# Patient Record
Sex: Male | Born: 1955 | Race: White | Hispanic: No | Smoking: Current every day smoker
Health system: Southern US, Community
[De-identification: ages and names within clinical notes are randomized; demographics above are authoritative.]

## PROBLEM LIST (undated history)

## (undated) DIAGNOSIS — R140 Abdominal distension (gaseous): Secondary | ICD-10-CM

## (undated) DIAGNOSIS — J449 Chronic obstructive pulmonary disease, unspecified: Secondary | ICD-10-CM

## (undated) DIAGNOSIS — R030 Elevated blood-pressure reading, without diagnosis of hypertension: Secondary | ICD-10-CM

## (undated) DIAGNOSIS — Z8619 Personal history of other infectious and parasitic diseases: Secondary | ICD-10-CM

## (undated) DIAGNOSIS — R7301 Impaired fasting glucose: Secondary | ICD-10-CM

## (undated) DIAGNOSIS — G479 Sleep disorder, unspecified: Secondary | ICD-10-CM

## (undated) DIAGNOSIS — Z720 Tobacco use: Secondary | ICD-10-CM

## (undated) DIAGNOSIS — E785 Hyperlipidemia, unspecified: Secondary | ICD-10-CM

## (undated) DIAGNOSIS — R9431 Abnormal electrocardiogram [ECG] [EKG]: Secondary | ICD-10-CM

## (undated) HISTORY — DX: Chronic obstructive pulmonary disease, unspecified: J44.9

## (undated) HISTORY — DX: Impaired fasting glucose: R73.01

## (undated) HISTORY — DX: Personal history of other infectious and parasitic diseases: Z86.19

## (undated) HISTORY — DX: Sleep disorder, unspecified: G47.9

## (undated) HISTORY — DX: Tobacco use: Z72.0

## (undated) HISTORY — DX: Elevated blood-pressure reading, without diagnosis of hypertension: R03.0

## (undated) HISTORY — DX: Hyperlipidemia, unspecified: E78.5

## (undated) HISTORY — DX: Abdominal distension (gaseous): R14.0

## (undated) HISTORY — DX: Abnormal electrocardiogram (ECG) (EKG): R94.31

---

## 2002-08-06 ENCOUNTER — Emergency Department (HOSPITAL_COMMUNITY): Admission: EM | Admit: 2002-08-06 | Discharge: 2002-08-06 | Payer: Self-pay | Admitting: Emergency Medicine

## 2006-08-15 ENCOUNTER — Emergency Department (HOSPITAL_COMMUNITY): Admission: EM | Admit: 2006-08-15 | Discharge: 2006-08-15 | Payer: Self-pay | Admitting: Emergency Medicine

## 2008-09-27 ENCOUNTER — Inpatient Hospital Stay (HOSPITAL_COMMUNITY): Admission: EM | Admit: 2008-09-27 | Discharge: 2008-10-02 | Payer: Self-pay | Admitting: Emergency Medicine

## 2008-09-27 ENCOUNTER — Ambulatory Visit: Payer: Self-pay | Admitting: Cardiology

## 2008-09-30 ENCOUNTER — Encounter: Payer: Self-pay | Admitting: Cardiology

## 2008-09-30 ENCOUNTER — Encounter (INDEPENDENT_AMBULATORY_CARE_PROVIDER_SITE_OTHER): Payer: Self-pay | Admitting: Internal Medicine

## 2008-10-14 DIAGNOSIS — J449 Chronic obstructive pulmonary disease, unspecified: Secondary | ICD-10-CM

## 2008-10-14 DIAGNOSIS — J4489 Other specified chronic obstructive pulmonary disease: Secondary | ICD-10-CM | POA: Insufficient documentation

## 2008-10-28 ENCOUNTER — Inpatient Hospital Stay (HOSPITAL_COMMUNITY): Admission: EM | Admit: 2008-10-28 | Discharge: 2008-10-31 | Payer: Self-pay | Admitting: Emergency Medicine

## 2008-11-12 ENCOUNTER — Encounter (INDEPENDENT_AMBULATORY_CARE_PROVIDER_SITE_OTHER): Payer: Self-pay | Admitting: *Deleted

## 2008-11-12 ENCOUNTER — Ambulatory Visit: Payer: Self-pay | Admitting: Cardiology

## 2008-11-12 DIAGNOSIS — R7309 Other abnormal glucose: Secondary | ICD-10-CM | POA: Insufficient documentation

## 2008-11-12 DIAGNOSIS — E785 Hyperlipidemia, unspecified: Secondary | ICD-10-CM

## 2008-11-12 DIAGNOSIS — F17200 Nicotine dependence, unspecified, uncomplicated: Secondary | ICD-10-CM

## 2008-11-21 ENCOUNTER — Ambulatory Visit (HOSPITAL_COMMUNITY): Admission: RE | Admit: 2008-11-21 | Discharge: 2008-11-21 | Payer: Self-pay | Admitting: Cardiology

## 2008-11-21 ENCOUNTER — Encounter: Payer: Self-pay | Admitting: Cardiology

## 2008-11-21 ENCOUNTER — Ambulatory Visit: Payer: Self-pay | Admitting: Cardiology

## 2008-11-25 ENCOUNTER — Encounter (INDEPENDENT_AMBULATORY_CARE_PROVIDER_SITE_OTHER): Payer: Self-pay | Admitting: *Deleted

## 2008-11-28 ENCOUNTER — Ambulatory Visit: Payer: Self-pay | Admitting: Cardiology

## 2008-11-28 DIAGNOSIS — G479 Sleep disorder, unspecified: Secondary | ICD-10-CM | POA: Insufficient documentation

## 2008-12-12 ENCOUNTER — Telehealth (INDEPENDENT_AMBULATORY_CARE_PROVIDER_SITE_OTHER): Payer: Self-pay | Admitting: *Deleted

## 2008-12-13 ENCOUNTER — Encounter (INDEPENDENT_AMBULATORY_CARE_PROVIDER_SITE_OTHER): Payer: Self-pay

## 2008-12-13 LAB — CONVERTED CEMR LAB
Cholesterol: 202 mg/dL
Free T4: 7.4 ng/dL
HDL: 61 mg/dL
LDL Cholesterol: 119 mg/dL
TSH: 1.477 microintl units/mL
Triglycerides: 108 mg/dL

## 2008-12-25 ENCOUNTER — Encounter (INDEPENDENT_AMBULATORY_CARE_PROVIDER_SITE_OTHER): Payer: Self-pay | Admitting: *Deleted

## 2009-01-03 ENCOUNTER — Encounter (INDEPENDENT_AMBULATORY_CARE_PROVIDER_SITE_OTHER): Payer: Self-pay

## 2009-05-06 ENCOUNTER — Encounter: Payer: Self-pay | Admitting: Urgent Care

## 2009-05-06 ENCOUNTER — Encounter: Payer: Self-pay | Admitting: Internal Medicine

## 2009-05-06 ENCOUNTER — Ambulatory Visit (HOSPITAL_COMMUNITY): Admission: RE | Admit: 2009-05-06 | Discharge: 2009-05-06 | Payer: Self-pay | Admitting: Internal Medicine

## 2009-05-06 ENCOUNTER — Ambulatory Visit: Payer: Self-pay | Admitting: Gastroenterology

## 2009-05-06 DIAGNOSIS — R1084 Generalized abdominal pain: Secondary | ICD-10-CM

## 2009-05-06 DIAGNOSIS — A599 Trichomoniasis, unspecified: Secondary | ICD-10-CM | POA: Insufficient documentation

## 2009-05-06 LAB — CONVERTED CEMR LAB: Creatinine, Ser: 0.68 mg/dL (ref 0.40–1.50)

## 2009-05-12 ENCOUNTER — Encounter (INDEPENDENT_AMBULATORY_CARE_PROVIDER_SITE_OTHER): Payer: Self-pay

## 2009-06-04 ENCOUNTER — Emergency Department (HOSPITAL_COMMUNITY): Admission: EM | Admit: 2009-06-04 | Discharge: 2009-06-04 | Payer: Self-pay | Admitting: Emergency Medicine

## 2009-07-14 ENCOUNTER — Ambulatory Visit: Payer: Self-pay | Admitting: Internal Medicine

## 2009-07-14 ENCOUNTER — Encounter: Payer: Self-pay | Admitting: Gastroenterology

## 2009-07-14 DIAGNOSIS — R1013 Epigastric pain: Secondary | ICD-10-CM

## 2009-07-14 DIAGNOSIS — K921 Melena: Secondary | ICD-10-CM

## 2009-07-14 DIAGNOSIS — R6881 Early satiety: Secondary | ICD-10-CM

## 2009-07-14 DIAGNOSIS — K5909 Other constipation: Secondary | ICD-10-CM

## 2009-07-14 DIAGNOSIS — K3189 Other diseases of stomach and duodenum: Secondary | ICD-10-CM

## 2009-07-19 ENCOUNTER — Emergency Department (HOSPITAL_COMMUNITY): Admission: EM | Admit: 2009-07-19 | Discharge: 2009-07-19 | Payer: Self-pay | Admitting: Emergency Medicine

## 2009-08-08 ENCOUNTER — Inpatient Hospital Stay (HOSPITAL_COMMUNITY): Admission: EM | Admit: 2009-08-08 | Discharge: 2009-08-09 | Payer: Self-pay | Admitting: Emergency Medicine

## 2009-08-11 ENCOUNTER — Ambulatory Visit: Payer: Self-pay | Admitting: Gastroenterology

## 2009-08-11 ENCOUNTER — Ambulatory Visit (HOSPITAL_COMMUNITY): Admission: RE | Admit: 2009-08-11 | Discharge: 2009-08-11 | Payer: Self-pay | Admitting: Gastroenterology

## 2009-10-22 ENCOUNTER — Ambulatory Visit (HOSPITAL_COMMUNITY): Admission: RE | Admit: 2009-10-22 | Discharge: 2009-10-22 | Payer: Self-pay | Admitting: Pulmonary Disease

## 2009-12-16 ENCOUNTER — Ambulatory Visit: Payer: Self-pay | Admitting: Gastroenterology

## 2009-12-16 DIAGNOSIS — K589 Irritable bowel syndrome without diarrhea: Secondary | ICD-10-CM

## 2010-01-29 ENCOUNTER — Inpatient Hospital Stay (HOSPITAL_COMMUNITY): Admission: EM | Admit: 2010-01-29 | Discharge: 2009-03-22 | Payer: Self-pay | Admitting: Emergency Medicine

## 2010-03-04 ENCOUNTER — Emergency Department (HOSPITAL_COMMUNITY)
Admission: EM | Admit: 2010-03-04 | Discharge: 2010-03-04 | Payer: Self-pay | Source: Home / Self Care | Admitting: Emergency Medicine

## 2010-03-07 ENCOUNTER — Emergency Department (HOSPITAL_COMMUNITY)
Admission: EM | Admit: 2010-03-07 | Discharge: 2010-03-07 | Payer: Self-pay | Source: Home / Self Care | Admitting: Emergency Medicine

## 2010-03-09 LAB — CULTURE, ROUTINE-ABSCESS

## 2010-03-15 ENCOUNTER — Encounter: Payer: Self-pay | Admitting: Pulmonary Disease

## 2010-03-24 NOTE — Assessment & Plan Note (Signed)
Summary: BLOATING/SS   Visit Type:  Initial Consult Referring Provider:  Juanetta Gosling Primary Care Provider:  Juanetta Gosling  Chief Complaint:  bloating.  History of Present Illness: 55 y/o caucasian Ford here for FU.  Was seen 05/06/2009 for abd bloating/pain and CT A/P w/ IV/oral contrast was normal.  Colonoscopy was recommended and he has yet to follow-up w/ scheduling this,  c/o continued abd bloating and early satiety.  c/o constipation w/ BM once or twice per week.  2 small volume episodes of hematochezia on tissue.  Denies N/V/D.  Denies heartburn or indigestion.  c/o mid-upper abd bloating.  "Belly always feels tight".  Weight up 4 # in 2 months.  Current Problems (verified): 1)  Early Satiety  (ICD-780.94) 2)  Dyspepsia  (ICD-536.8) 3)  Constipation, Chronic  (ICD-564.09) 4)  Hematochezia  (ICD-578.1) 5)  Abdominal Pain, Generalized  (ICD-789.07) 6)  Sleep Disorder  (ICD-780.50) 7)  Fasting Hyperglycemia  (ICD-790.29) 8)  Hypertension--borderline  (ICD-401.9) 9)  Hyperlipidemia  (ICD-272.4) 10)  Tobacco Abuse  (ICD-305.1) 11)  COPD  (ICD-496) 12)  Hx of Trichomoniasis--ureteritis  (ICD-131.9)  Current Medications (verified): 1)  Aspir-Low 81 Mg Tbec (Aspirin) .... Take 1 Tab Daily 2)  Albuterol Sulfate (5 Mg/ml) 0.5% Nebu (Albuterol Sulfate) 3)  Atrovent 0.06 % Soln (Ipratropium Bromide) 4)  Advair Diskus 250-50 Mcg/dose Aepb (Fluticasone-Salmeterol) 5)  Daily Multi  Tabs (Multiple Vitamins-Minerals) .... Take 1 Tab Daily 6)  Percocet 5-325 Mg Tabs (Oxycodone-Acetaminophen) .... One Tablet Every 4 Hours As Needed 7)  Furosemide 40 Mg Tabs (Furosemide) .... Take 1 Tablet By Mouth Once A Day 8)  Requip 1 Mg Tabs (Ropinirole Hcl) .... Take 1 Tablet By Mouth Once A Day 9)  Combivent Inhaler .... As Needed 10)  Pro Air .... As Directed 11)  Proventil .... As Directed 12)  Oxygen .... At Bedtime or As Needed 13)  Ventolin Hfa 108 (90 Base) Mcg/act Aers (Albuterol Sulfate) .... As  Directed  Allergies (verified): No Known Drug Allergies  Past History:  Past Medical History: COPD (ICD-496): 70-pack-year history of cigarette smoking, 2.5liters qhs Abnormal electrocardiogram: Deep T-wave inversion, particularly anterolaterally, on 09/28/2008; subsequently resolved. History of Trichomonas urethritis ABDOMINAL PAIN & BLOATING, early satiety SLEEP DISORDER (ICD-780.50) FASTING HYPERGLYCEMIA (ICD-790.29) HYPERTENSION--BORDERLINE (ICD-401.9) HYPERLIPIDEMIA (ICD-272.4) TOBACCO ABUSE (ICD-305.1) Never had colonoscopy  Review of Systems General:  Complains of fatigue; denies fever, chills, sweats, anorexia, weakness, malaise, weight loss, and sleep disorder. CV:  Denies chest pains, angina, palpitations, syncope, dyspnea on exertion, orthopnea, PND, peripheral edema, and claudication. Resp:  Denies dyspnea at rest, dyspnea with exercise, cough, sputum, wheezing, coughing up blood, and pleurisy. GI:  See HPI. GU:  Complains of urinary frequency; denies urinary burning, blood in urine, urinary hesitancy, and nocturnal urination. Derm:  Denies rash, itching, dry skin, hives, moles, warts, and unhealing ulcers. Psych:  Denies depression, anxiety, memory loss, suicidal ideation, hallucinations, paranoia, phobia, and confusion. Heme:  Denies bruising, bleeding, and enlarged lymph nodes.  Vital Signs:  Patient profile:   55 year old Ford Height:      65 inches Weight:      203 pounds BMI:     33.90 Temp:     99.2 degrees F Pulse rate:   96 / minute BP sitting:   110 / 78  (left arm) Cuff size:   large  Vitals Entered By: Cloria Spring LPN (Jul 14, 2009 1:37 PM)  Physical Exam  General:  Well developed, well nourished,w/ c/o pain Head:  Normocephalic and  atraumatic. Eyes:  Sclera clear, no icterus. Ears:  Normal auditory acuity. Nose:  No deformity, discharge,  or lesions. Mouth:  No deformity or lesions, poor dentition Neck:  Supple; no masses or  thyromegaly. Lungs:  Decreased BS w/ insp/exp wheezes. NAD. Heart:  Regular rate and rhythm; no murmurs, rubs,  or bruits. Abdomen:  Soft, mildly distended. No masses, hepatosplenomegaly or hernias noted. Normal bowel sounds.without guarding and without rebound.  Tenderness to mid abd, and mod tenderness diffusely. Rectal:  deferred until time of colonoscopy.   Msk:  Symmetrical with no gross deformities. Normal posture. Pulses:  Normal pulses noted. Extremities:  No clubbing, cyanosis, edema or deformities noted. Neurologic:  Alert and  oriented x4;  grossly normal neurologically. Skin:  Intact without significant lesions or rashes. Cervical Nodes:  No significant cervical adenopathy. Psych:  Alert and cooperative. Normal mood and affect.  Impression & Recommendations:  Problem # 1:  EARLY SATIETY (ICD-780.94) 55 y/o caucasian Ford w/ chronic abd bloating, pain, ear;y satiety, constipation & hematochezia.  CT benign.  Severe COPD.  Needs colonoscopy and EGD. ? related to chronic constipation, SBBO, functional abd pain, less likely colorectal ca, or PUD.  Diagnostic colonoscopy and EGD to be performed by Dr. Jonette Eva in the near future.  I have discussed risks and benefits which include, but are not limited to, bleeding, infection, perforation, or medication reaction.  The patient agrees with this plan and consent will be obtained.   Orders: Est. Patient Level III ([address]40)  Problem # 2:  ABDOMINAL PAIN, GENERALIZED (ICD-789.07) See #1  Problem # 3:  DYSPEPSIA (ICD-536.8) See #1  Problem # 4:  HEMATOCHEZIA (ICD-578.1) See #1  Problem # 5:  CONSTIPATION, CHRONIC (ICD-564.09) See #1  Patient Instructions: 1)  Begin Align once daily (2 boxes) 2)  Begin miralax as directed for constipation Prescriptions: MIRALAX  POWD (POLYETHYLENE GLYCOL 3350) 17 grams by mouth daily as needed for constipation  #527 grams x 5   Entered and Authorized by:   Joselyn Arrow FNP-BC   Signed by:    Joselyn Arrow FNP-BC on 07/14/2009   Method used:   Electronically to        [facility]  [address] (retail)       [address]       [place], [place]  [address]       Ph: [phone]       Fax: [phone]   RxID:   [id number]   Appended Document: BLOATING/SS APR 2011: 198 LBS

## 2010-03-24 NOTE — Letter (Signed)
Summary: TCS/EGD ORDER  TCS/EGD ORDER   Imported By: Ave Filter 07/14/2009 15:04:44  _____________________________________________________________________  External Attachment:    Type:   Image     Comment:   External Document

## 2010-03-24 NOTE — Assessment & Plan Note (Signed)
Summary: ABD PAIN/CM   Visit Type:  Initial Consult Referring Provider:  Dr Juanetta Gosling Primary Care Provider:  Juanetta Gosling  Chief Complaint:  abd pain/tightness.  History of Present Illness: 55 y/o caucasian male w/ mid abd pain & "feeling tight" x couple months.  c/o constant pressure 10/10.  No particular time of day, worse w/ movt, not assoc w/ N/V/D.  Denies any hx constipation.  Pain not assoc w/ eating.  Occ heartburn & indigestion couple times per yr.  Weight gain 25# over 2-3 months.  BM BID.  Denies rectal bleeding or melena.  Denies hx GI problems.  Hx arthritis in back & peripheral neuropathy. Pt has oxycodone at home that he takes as needed pain.    Current Problems (verified): 1)  Abdominal Pain, Generalized  (ICD-789.07) 2)  Sleep Disorder  (ICD-780.50) 3)  Abnormal Electrocardiogram  (ICD-794.31) 4)  Fasting Hyperglycemia  (ICD-790.29) 5)  Hypertension--borderline  (ICD-401.9) 6)  Hyperlipidemia  (ICD-272.4) 7)  Tobacco Abuse  (ICD-305.1) 8)  COPD  (ICD-496) 9)  Hx of Trichomoniasis--ureteritis  (ICD-131.9)  Current Medications (verified): 1)  Aspir-Low 81 Mg Tbec (Aspirin) .... Take 1 Tab Daily 2)  Albuterol Sulfate (5 Mg/ml) 0.5% Nebu (Albuterol Sulfate) 3)  Atrovent 0.06 % Soln (Ipratropium Bromide) 4)  Advair Diskus 250-50 Mcg/dose Aepb (Fluticasone-Salmeterol) 5)  Daily Multi  Tabs (Multiple Vitamins-Minerals) .... Take 1 Tab Daily 6)  Percocet 5-325 Mg Tabs (Oxycodone-Acetaminophen) .... One Tablet Every 4 Hours As Needed 7)  Furosemide 40 Mg Tabs (Furosemide) .... Take 1 Tablet By Mouth Once A Day 8)  Requip 1 Mg Tabs (Ropinirole Hcl) .... Take 1 Tablet By Mouth Once A Day 9)  Combivent Inhaler .... As Needed 10)  Pro Air .... As Directed 11)  Proventil .... As Directed 12)  Oxygen .... At Bedtime or As Needed  Allergies (verified): No Known Drug Allergies  Past History:  Past Medical History: COPD (ICD-496): 70-pack-year history of cigarette  smoking Abnormal electrocardiogram: Deep T-wave inversion, particularly anterolaterally, on 09/28/2008; subsequently resolved. History of Trichomonas urethritis ABDOMINAL PAIN, GENERALIZED (ICD-789.07) SLEEP DISORDER (ICD-780.50) ABNORMAL ELECTROCARDIOGRAM (ICD-794.31) FASTING HYPERGLYCEMIA (ICD-790.29) HYPERTENSION--BORDERLINE (ICD-401.9) HYPERLIPIDEMIA (ICD-272.4) TOBACCO ABUSE (ICD-305.1) Never had colonoscopy  Family History: Father:(82) ? arrhythmia Mother:(deceased 5) due to pancreatic cancer; history of coronary artery disease, hypertension and diabetes Siblings:8 ?  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   Review of Systems General:  Complains of sleep disorder; denies fever, chills, sweats, anorexia, fatigue, weakness, malaise, and weight loss. CV:  Denies chest pains, angina, palpitations, syncope, dyspnea on exertion, orthopnea, PND, peripheral edema, and claudication. Resp:  Denies dyspnea at rest, dyspnea with exercise, cough, sputum, wheezing, coughing up blood, and pleurisy. GI:  Denies difficulty swallowing, pain on swallowing, jaundice, bloody BM's, black BMs, and fecal incontinence. GU:  Denies urinary burning, blood in urine, urinary frequency, urinary hesitancy, nocturnal urination, and urinary incontinence. Derm:  Complains of rash; denies itching, dry skin, hives, moles, warts, and unhealing ulcers; rash on back few wks ago "looked like chicken pox". Psych:  Denies depression, anxiety, memory loss, suicidal ideation, hallucinations, paranoia, phobia, and confusion. Heme:  Complains of bruising; denies bleeding and enlarged lymph nodes.  Vital Signs:  Patient profile:   55 year old male Height:  65 inches Weight:      199 pounds BMI:      33.24 Temp:     97.9 degrees F oral Pulse rate:   96 / minute BP sitting:   142 / 80  (left arm) Cuff size:   regular  Vitals Entered By: Cloria Spring LPN (May 06, 2009 11:47 AM)  Physical Exam  General:  Well developed, well nourished,w/ c/o pain Head:  Normocephalic and atraumatic. Eyes:  Sclera clear, no icterus. Ears:  Normal auditory acuity. Nose:  No deformity, discharge,  or lesions. Mouth:  No deformity or lesions, dentition normal. Neck:  Supple; no masses or thyromegaly. Lungs:  Clear throughout to auscultation. Heart:  Regular rate and rhythm; no murmurs, rubs,  or bruits. Abdomen:  Soft, mildly distended. No masses, hepatosplenomegaly or hernias noted. Normal bowel sounds.without guarding and without rebound.  Tenderness to mid abd, and mod tenderness diffusely. Rectal:  deferred until time of colonoscopy.   Msk:  Symmetrical with no gross deformities. Normal posture. Pulses:  Normal pulses noted. Extremities:  No clubbing, cyanosis, edema or deformities noted. Neurologic:  Alert and  oriented x4;  grossly normal neurologically. Skin:  Intact without significant lesions or rashes. Cervical Nodes:  No significant cervical adenopathy. Psych:  Alert and cooperative. Normal mood and affect.  Impression & Recommendations:  Problem # 1:  ABDOMINAL PAIN, GENERALIZED (ICD-789.07) 55 y/o caucasian male w/ hx COPD & chronic back pain with 1 month hx worsening severe abd pain not associated w/ nausea, vomiting, change in bowel habits or fever.  ?etiology of severe diffuse pain ?appendicitis, pancreatitis, diverticulitis, AAA, ischemia, occult malignancy.  Will need colonoscopy as well as CT ABD/Pelvis w/ IV/oral contrast asap.  Will check creatinine 1st.  Request recent labs from Dr. Juanetta Gosling.  Colonoscopy to be performed by Dr. Darrick Penna in the near future.  I have discussed risks and benefits which include, but are not limited to, bleeding, infection, perforation, or  medication reaction.  The patient agrees with this plan and consent will be obtained.   Orders: Consultation Level III [phone])  Other Orders: T-Creatinine Blood ([id number])  Patient Instructions: 1)  CT As soon as possible 2)  To ER if severe pain, nausea or vomiting 3)  We requested labs from Dr Juanetta Gosling.  Will call if more labs needed.     Appended Document: ABD PAIN/CM Please call pt. His CT scan was normal. Proceed with TCS. He should see Dr. Juanetta Gosling and ask him to check his urine.   Appended Document: ABD PAIN/CM LMOM to call.  Appended Document: ABD PAIN/CM tried to call pt- LMOM  Appended Document: ABD PAIN/CM LMOM to call. Mailing letter to call also.  Appended Document: ABD PAIN/CM 04/17/2009 labs reviewed-> Met 7, Mag normal  Appended Document: ABD PAIN/CM I called pt to schedule tcs, no answer,lmom.  Appended Document: ABD PAIN/CM spoke to pt- he said he will call back when hes ready to schedule tcs.

## 2010-03-24 NOTE — Letter (Signed)
Summary: CT SCAN ORDER  CT SCAN ORDER   Imported By: Ave Filter 05/06/2009 13:20:32  _____________________________________________________________________  External Attachment:    Type:   Image     Comment:   External Document

## 2010-03-24 NOTE — Assessment & Plan Note (Signed)
Summary: IBS   Visit Type:  Follow-up Visit Primary Care Danayah Smyre:  Juanetta Gosling, m.d.  Chief Complaint:  follow up- having some abd pain and ? hernia.  History of Present Illness: Gained 9 lbs since MAY 2011. Smelly urine and BMs. Takes Percocet 1.5 tabs a day. Water: whole lot of tea. BMs: q1-2 days, soft. Appetite: LIKES MILK, ICE CREAM, AND CHEESE.  Current Medications (verified): 1)  Aspir-Low 81 Mg Tbec (Aspirin) .... Take 1 Tab Daily 2)  Albuterol Sulfate (5 Mg/ml) 0.5% Nebu (Albuterol Sulfate) 3)  Atrovent 0.06 % Soln (Ipratropium Bromide) 4)  Advair Diskus 250-50 Mcg/dose Aepb (Fluticasone-Salmeterol) 5)  Daily Multi  Tabs (Multiple Vitamins-Minerals) .... Take 1 Tab Daily 6)  Percocet 5-325 Mg Tabs (Oxycodone-Acetaminophen) .... One Tablet Every 4 Hours As Needed 7)  Requip 1 Mg Tabs (Ropinirole Hcl) .... Take 1 Tablet By Mouth Once A Day 8)  Combivent Inhaler .... As Needed 9)  Pro Air .... As Directed 10)  Proventil .... As Directed 11)  Oxygen .... At Bedtime or As Needed 12)  Ventolin Hfa 108 (90 Base) Mcg/act Aers (Albuterol Sulfate) .... As Directed  Allergies (verified): No Known Drug Allergies  Past History:  Past Surgical History: Last updated: 10/14/2008 no past surgical history  Past Medical History: COPD (ICD-496): 70-pack-year history of cigarette smoking, 2.5liters qhs Abnormal electrocardiogram: Deep T-wave inversion, particularly anterolaterally, on 09/28/2008; subsequently resolved. History of Trichomonas urethritis ABDOMINAL PAIN & BLOATING, early satiety SLEEP DISORDER (ICD-780.50) FASTING HYPERGLYCEMIA (ICD-790.29) HYPERTENSION--BORDERLINE (ICD-401.9) HYPERLIPIDEMIA (ICD-272.4) TOBACCO ABUSE (ICD-305.1)  Family History: Father:(82) ? arrhythmia Mother:(deceased 38) due to pancreatic cancer; history of coronary artery disease, hypertension and diabetes Siblings:8 ? 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   Vital Signs:  Patient profile:   55 year old male Height:      65 inches Weight:      212 pounds BMI:     35.41 Temp:     98.2 degrees F oral Pulse rate:   80 / minute BP sitting:   142 / 76  (left arm) Cuff size:   regular  Vitals Entered By: Hendricks Limes LPN (December 16, 2009 8:50 AM)  Physical Exam  General:  Well developed, well nourished, no acute distress. Head:  Normocephalic and atraumatic. Eyes:  Sclera clear, no icterus. Mouth:  No deformity or lesions, poor dentition Lungs:  Clear throughout to auscultation. Heart:  Regular rate and rhythm; no murmurs. Abdomen:  Soft, nontender and nondistended. Normal bowel sounds. ABD WALL DEFECT WITH VALSALVA. Extremities:  No edema noted.  Impression & Recommendations:  Problem # 1:  IRRITABLE BOWEL SYNDROME (ICD-564.1) Assessment Unchanged EAT 4-6 SMALL MEALS DAILY. DRNK 6 TO 8 CUPS OF WATER A DAY. STOP DRINKING TEA. LOSE 10-20 LBS. FOLLOW A HIGH FIBER DIET. SEE HO. Avoid fiber items that cause bloating. Follow up in 4 mos. Consider imipramine or SSRI if Sx not significantly improved.  CC: PCP  Patient Instructions: 1)  EAT 4-6 SMALL MEALS DAILY. 2)  DRNK 6 TO 8 CUPS OF WATER A DAY. 3)  STOP DRINKING TEA. 4)  LOSE 10-20 LBS. 5)  FOLLOW A HIGH FIBER DIET. SEE HO. 6)  Avoid fiber items that cause bloating.  7)  Follow up in 4 mos. 8)  The medication list was reviewed and reconciled.  All changed / newly prescribed medications were explained.  A complete medication list was provided to the patient / caregiver.  Appended Document: Orders Update    Clinical Lists Changes  Orders: Added new Service order of Est. Patient Level III ([address]) - Signed      Appended Document: IBS  F/U REMINDER IS IN THE COMPUTER

## 2010-03-24 NOTE — Letter (Signed)
Summary: Plan of Care, Need to Discuss  [facility]  [address]   [place], [place] [address]   Phone: [phone]  Fax: [phone]    May 12, 2009  Zachary Ford [address] [place], [place]  [address] [birth year]   Dear Zachary Ford,   We are writing this letter to inform you of treatment plans and/or discuss your plan of care.  We have tried several times to contact you; however, we have yet to reach you.  We ask that you please contact our office for follow-up on your gastrointestinal issues.  We can  be reached at [phone] to schedule an appointment, or to speak with someone regarding your health care needs.  Please do not neglect your health.   Sincerely,    Cloria Spring LPN  [facility] Ph: [phone]    Fax: [phone]

## 2010-04-02 ENCOUNTER — Encounter (INDEPENDENT_AMBULATORY_CARE_PROVIDER_SITE_OTHER): Payer: Self-pay | Admitting: *Deleted

## 2010-04-09 NOTE — Letter (Signed)
Summary: Recall Office Visit  [facility]  [address]   [place], [place] [address]   Phone: [phone]  Fax: [phone]      April 02, 2010   RHYLAND HINDERLITER [address] [place], [place]  [address] 1956/01/20   Dear Mr. Schremp,   According to our records, it is time for you to schedule a follow-up office visit with [place].   At your convenience, please call [phone] to schedule an office visit. If you have any questions, concerns, or feel that this letter is in error, we would appreciate your call.   Sincerely,    Diana Eves  [facility] Ph: [phone]   Fax: [phone]

## 2010-05-10 LAB — CBC
HCT: 42.4 % (ref 39.0–52.0)
Hemoglobin: 14.1 g/dL (ref 13.0–17.0)
MCHC: 33.2 g/dL (ref 30.0–36.0)
MCHC: 33.3 g/dL (ref 30.0–36.0)
MCHC: 33.3 g/dL (ref 30.0–36.0)
MCV: 89.7 fL (ref 78.0–100.0)
Platelets: 423 10*3/uL — ABNORMAL HIGH (ref 150–400)
Platelets: 281 10*3/uL (ref 150–400)
RBC: 4.58 MIL/uL (ref 4.22–5.81)
RBC: 4.73 MIL/uL (ref 4.22–5.81)
RDW: 12.5 % (ref 11.5–15.5)
RDW: 12.8 % (ref 11.5–15.5)
RDW: 13.3 % (ref 11.5–15.5)
WBC: 18.8 10*3/uL — ABNORMAL HIGH (ref 4.0–10.5)

## 2010-05-10 LAB — CULTURE, BLOOD (ROUTINE X 2)
Culture: NO GROWTH
Culture: NO GROWTH

## 2010-05-10 LAB — DIFFERENTIAL
Basophils Absolute: 0 10*3/uL (ref 0.0–0.1)
Basophils Absolute: 0 10*3/uL (ref 0.0–0.1)
Basophils Absolute: 0 10*3/uL (ref 0.0–0.1)
Basophils Relative: 0 % (ref 0–1)
Basophils Relative: 0 % (ref 0–1)
Basophils Relative: 0 % (ref 0–1)
Lymphocytes Relative: 14 % (ref 12–46)
Monocytes Absolute: 1.7 10*3/uL — ABNORMAL HIGH (ref 0.1–1.0)
Monocytes Relative: 4 % (ref 3–12)
Monocytes Relative: 7 % (ref 3–12)
Neutro Abs: 19.4 10*3/uL — ABNORMAL HIGH (ref 1.7–7.7)
Neutro Abs: 26.8 10*3/uL — ABNORMAL HIGH (ref 1.7–7.7)
Neutro Abs: 14.1 10*3/uL — ABNORMAL HIGH (ref 1.7–7.7)
Neutrophils Relative %: 83 % — ABNORMAL HIGH (ref 43–77)
Neutrophils Relative %: 91 % — ABNORMAL HIGH (ref 43–77)
Neutrophils Relative %: 75 % (ref 43–77)

## 2010-05-10 LAB — URINALYSIS, ROUTINE W REFLEX MICROSCOPIC
Protein, ur: >300 mg/dL — AB
Urobilinogen, UA: 1 mg/dL (ref 0.0–1.0)

## 2010-05-10 LAB — URINE MICROSCOPIC-ADD ON

## 2010-05-10 LAB — URINE CULTURE

## 2010-05-10 LAB — BASIC METABOLIC PANEL
BUN: 10 mg/dL (ref 6–23)
CO2: 33 mEq/L — ABNORMAL HIGH (ref 19–32)
Calcium: 8.8 mg/dL (ref 8.4–10.5)
Creatinine, Ser: 0.61 mg/dL (ref 0.4–1.5)
Glucose, Bld: 119 mg/dL — ABNORMAL HIGH (ref 70–99)

## 2010-05-10 LAB — COMPREHENSIVE METABOLIC PANEL
Albumin: 3.8 g/dL (ref 3.5–5.2)
Alkaline Phosphatase: 64 U/L (ref 39–117)
BUN: 7 mg/dL (ref 6–23)
Creatinine, Ser: 0.62 mg/dL (ref 0.4–1.5)
Glucose, Bld: 113 mg/dL — ABNORMAL HIGH (ref 70–99)
Potassium: 3.8 mEq/L (ref 3.5–5.1)
Total Protein: 6.9 g/dL (ref 6.0–8.3)

## 2010-05-10 LAB — POCT CARDIAC MARKERS: CKMB, poc: 4.2 ng/mL (ref 1.0–8.0)

## 2010-05-10 LAB — PROTIME-INR: INR: 0.92 (ref 0.00–1.49)

## 2010-05-29 LAB — CULTURE, BLOOD (ROUTINE X 2)
Culture: NO GROWTH
Culture: NO GROWTH
Report Status: 9112010

## 2010-05-29 LAB — BLOOD GAS, ARTERIAL
Acid-Base Excess: 2.1 mmol/L — ABNORMAL HIGH (ref 0.0–2.0)
Acid-Base Excess: 4.7 mmol/L — ABNORMAL HIGH (ref 0.0–2.0)
Bicarbonate: 30 mEq/L — ABNORMAL HIGH (ref 20.0–24.0)
Bicarbonate: 30.4 mEq/L — ABNORMAL HIGH (ref 20.0–24.0)
Delivery systems: POSITIVE
Expiratory PAP: 6
Expiratory PAP: 6
FIO2: 100 %
FIO2: 21 %
Inspiratory PAP: 16
Mode: POSITIVE
O2 Content: 2 L/min
O2 Content: 2 L/min
O2 Content: 3 L/min
O2 Saturation: 91 %
O2 Saturation: 93.3 %
O2 Saturation: 98.5 %
O2 Saturation: 99.4 %
Patient temperature: 37
Patient temperature: 37
Patient temperature: 37
Patient temperature: 37
TCO2: 26.1 mmol/L (ref 0–100)
TCO2: 26.7 mmol/L (ref 0–100)
TCO2: 26.8 mmol/L (ref 0–100)
pCO2 arterial: 52.4 mmHg — ABNORMAL HIGH (ref 35.0–45.0)
pCO2 arterial: 53 mmHg — ABNORMAL HIGH (ref 35.0–45.0)
pCO2 arterial: 59.5 mmHg (ref 35.0–45.0)
pCO2 arterial: 75.5 mmHg (ref 35.0–45.0)
pH, Arterial: 7.236 — ABNORMAL LOW (ref 7.350–7.450)
pH, Arterial: 7.326 — ABNORMAL LOW (ref 7.350–7.450)
pH, Arterial: 7.362 (ref 7.350–7.450)
pH, Arterial: 7.372 (ref 7.350–7.450)
pO2, Arterial: 363 mmHg — ABNORMAL HIGH (ref 80.0–100.0)
pO2, Arterial: 57.2 mmHg — ABNORMAL LOW (ref 80.0–100.0)
pO2, Arterial: 71.3 mmHg — ABNORMAL LOW (ref 80.0–100.0)
pO2, Arterial: 74.8 mmHg — ABNORMAL LOW (ref 80.0–100.0)

## 2010-05-29 LAB — GLUCOSE, CAPILLARY
Glucose-Capillary: 121 mg/dL — ABNORMAL HIGH (ref 70–99)
Glucose-Capillary: 128 mg/dL — ABNORMAL HIGH (ref 70–99)
Glucose-Capillary: 146 mg/dL — ABNORMAL HIGH (ref 70–99)

## 2010-05-29 LAB — DIFFERENTIAL
Basophils Absolute: 0 10*3/uL (ref 0.0–0.1)
Basophils Relative: 0 % (ref 0–1)
Basophils Relative: 0 % (ref 0–1)
Eosinophils Absolute: 0 10*3/uL (ref 0.0–0.7)
Eosinophils Absolute: 0 10*3/uL (ref 0.0–0.7)
Eosinophils Relative: 0 % (ref 0–5)
Eosinophils Relative: 0 % (ref 0–5)
Lymphocytes Relative: 11 % — ABNORMAL LOW (ref 12–46)
Lymphocytes Relative: 5 % — ABNORMAL LOW (ref 12–46)
Lymphs Abs: 0.9 10*3/uL (ref 0.7–4.0)
Lymphs Abs: 1.8 10*3/uL (ref 0.7–4.0)
Monocytes Absolute: 0.9 10*3/uL (ref 0.1–1.0)
Monocytes Relative: 3 % (ref 3–12)
Monocytes Relative: 5 % (ref 3–12)
Monocytes Relative: 8 % (ref 3–12)
Neutro Abs: 13 10*3/uL — ABNORMAL HIGH (ref 1.7–7.7)
Neutrophils Relative %: 78 % — ABNORMAL HIGH (ref 43–77)
Neutrophils Relative %: 90 % — ABNORMAL HIGH (ref 43–77)
Neutrophils Relative %: 91 % — ABNORMAL HIGH (ref 43–77)

## 2010-05-29 LAB — CBC
HCT: 42.6 % (ref 39.0–52.0)
Hemoglobin: 16.2 g/dL (ref 13.0–17.0)
MCHC: 33.8 g/dL (ref 30.0–36.0)
MCV: 89.3 fL (ref 78.0–100.0)
MCV: 89.7 fL (ref 78.0–100.0)
Platelets: 238 10*3/uL (ref 150–400)
Platelets: 251 10*3/uL (ref 150–400)
RBC: 4.76 MIL/uL (ref 4.22–5.81)
RBC: 4.88 MIL/uL (ref 4.22–5.81)
RBC: 5.39 MIL/uL (ref 4.22–5.81)
WBC: 12.6 10*3/uL — ABNORMAL HIGH (ref 4.0–10.5)
WBC: 16.6 10*3/uL — ABNORMAL HIGH (ref 4.0–10.5)
WBC: 19 10*3/uL — ABNORMAL HIGH (ref 4.0–10.5)

## 2010-05-29 LAB — POCT CARDIAC MARKERS
Myoglobin, poc: 60.9 ng/mL (ref 12–200)
Myoglobin, poc: 73.2 ng/mL (ref 12–200)

## 2010-05-29 LAB — COMPREHENSIVE METABOLIC PANEL
AST: 20 U/L (ref 0–37)
Albumin: 3.1 g/dL — ABNORMAL LOW (ref 3.5–5.2)
Alkaline Phosphatase: 38 U/L — ABNORMAL LOW (ref 39–117)
Chloride: 103 mEq/L (ref 96–112)
GFR calc Af Amer: >60 mL/min (ref >60)
Potassium: 4.6 mEq/L (ref 3.5–5.1)
Sodium: 142 mEq/L (ref 135–145)
Total Bilirubin: 0.5 mg/dL (ref 0.3–1.2)

## 2010-05-29 LAB — BASIC METABOLIC PANEL
BUN: 14 mg/dL (ref 6–23)
CO2: 30 mEq/L (ref 19–32)
Calcium: 9 mg/dL (ref 8.4–10.5)
Chloride: 101 mEq/L (ref 96–112)
Chloride: 103 mEq/L (ref 96–112)
Creatinine, Ser: 0.69 mg/dL (ref 0.4–1.5)
Creatinine, Ser: 0.76 mg/dL (ref 0.4–1.5)
Creatinine, Ser: 0.77 mg/dL (ref 0.4–1.5)
GFR calc Af Amer: >60 mL/min (ref >60)
GFR calc Af Amer: >60 mL/min (ref >60)
Potassium: 4.4 mEq/L (ref 3.5–5.1)
Potassium: 4.4 mEq/L (ref 3.5–5.1)
Sodium: 139 mEq/L (ref 135–145)

## 2010-05-29 LAB — D-DIMER, QUANTITATIVE: D-Dimer, Quant: 0.51 ug/mL-FEU — ABNORMAL HIGH (ref 0.00–0.48)

## 2010-05-29 LAB — EXPECTORATED SPUTUM ASSESSMENT W GRAM STAIN, RFLX TO RESP C

## 2010-05-29 LAB — CULTURE, RESPIRATORY W GRAM STAIN

## 2010-05-30 LAB — BLOOD GAS, ARTERIAL
Acid-Base Excess: 12 mmol/L — ABNORMAL HIGH (ref 0.0–2.0)
Acid-Base Excess: 12.2 mmol/L — ABNORMAL HIGH (ref 0.0–2.0)
Acid-Base Excess: 5.5 mmol/L — ABNORMAL HIGH (ref 0.0–2.0)
Acid-Base Excess: 5.7 mmol/L — ABNORMAL HIGH (ref 0.0–2.0)
Acid-Base Excess: 6.4 mmol/L — ABNORMAL HIGH (ref 0.0–2.0)
Acid-Base Excess: 8.4 mmol/L — ABNORMAL HIGH (ref 0.0–2.0)
Bicarbonate: 30.6 mEq/L — ABNORMAL HIGH (ref 20.0–24.0)
Bicarbonate: 30.8 mEq/L — ABNORMAL HIGH (ref 20.0–24.0)
Bicarbonate: 31.7 mEq/L — ABNORMAL HIGH (ref 20.0–24.0)
Bicarbonate: 34 mEq/L — ABNORMAL HIGH (ref 20.0–24.0)
Bicarbonate: 37.9 mEq/L — ABNORMAL HIGH (ref 20.0–24.0)
Bicarbonate: 39.4 mEq/L — ABNORMAL HIGH (ref 20.0–24.0)
Delivery systems: POSITIVE
Delivery systems: POSITIVE
Expiratory PAP: 8
FIO2: 0.35 %
FIO2: 0.4 %
Inspiratory PAP: 20
Inspiratory PAP: 20
Mode: POSITIVE
O2 Content: 2 L/min
O2 Content: 35 L/min
O2 Content: 40 L/min
O2 Content: 50 L/min
O2 Saturation: 93.4 %
O2 Saturation: 96.3 %
O2 Saturation: 96.3 %
O2 Saturation: 97 %
O2 Saturation: 97.4 %
O2 Saturation: 97.9 %
PEEP: 5 cmH2O
Patient temperature: 37
Patient temperature: 37
Patient temperature: 37
Patient temperature: 98.6
TCO2: 27.3 mmol/L (ref 0–100)
TCO2: 29.3 mmol/L (ref 0–100)
TCO2: 34.7 mmol/L (ref 0–100)
TCO2: 34.8 mmol/L (ref 0–100)
TCO2: 35.3 mmol/L (ref 0–100)
pCO2 arterial: 44.7 mmHg (ref 35.0–45.0)
pCO2 arterial: 57 mmHg — ABNORMAL HIGH (ref 35.0–45.0)
pCO2 arterial: 62.2 mmHg (ref 35.0–45.0)
pCO2 arterial: 89.3 mmHg (ref 35.0–45.0)
pCO2 arterial: 91.4 mmHg (ref 35.0–45.0)
pCO2 arterial: 99.1 mmHg (ref 35.0–45.0)
pH, Arterial: 7.227 — ABNORMAL LOW (ref 7.350–7.450)
pH, Arterial: 7.24 — ABNORMAL LOW (ref 7.350–7.450)
pH, Arterial: 7.258 — ABNORMAL LOW (ref 7.350–7.450)
pH, Arterial: 7.356 (ref 7.350–7.450)
pH, Arterial: 7.406 (ref 7.350–7.450)
pO2, Arterial: 187 mmHg — ABNORMAL HIGH (ref 80.0–100.0)
pO2, Arterial: 76.7 mmHg — ABNORMAL LOW (ref 80.0–100.0)
pO2, Arterial: 83.7 mmHg (ref 80.0–100.0)
pO2, Arterial: 87.1 mmHg (ref 80.0–100.0)
pO2, Arterial: 89.4 mmHg (ref 80.0–100.0)
pO2, Arterial: 97.6 mmHg (ref 80.0–100.0)

## 2010-05-30 LAB — CBC
HCT: 42.5 % (ref 39.0–52.0)
HCT: 43 % (ref 39.0–52.0)
HCT: 43.3 % (ref 39.0–52.0)
HCT: 47.9 % (ref 39.0–52.0)
Hemoglobin: 14.4 g/dL (ref 13.0–17.0)
Hemoglobin: 16 g/dL (ref 13.0–17.0)
MCHC: 33.3 g/dL (ref 30.0–36.0)
MCHC: 33.5 g/dL (ref 30.0–36.0)
MCV: 91.1 fL (ref 78.0–100.0)
MCV: 91.7 fL (ref 78.0–100.0)
MCV: 91.8 fL (ref 78.0–100.0)
MCV: 91.9 fL (ref 78.0–100.0)
MCV: 92.3 fL (ref 78.0–100.0)
Platelets: 209 10*3/uL (ref 150–400)
Platelets: 229 10*3/uL (ref 150–400)
Platelets: 229 10*3/uL (ref 150–400)
RBC: 4.63 MIL/uL (ref 4.22–5.81)
RBC: 4.72 MIL/uL (ref 4.22–5.81)
RBC: 4.73 MIL/uL (ref 4.22–5.81)
RBC: 4.8 MIL/uL (ref 4.22–5.81)
RBC: 5.21 MIL/uL (ref 4.22–5.81)
RBC: 5.58 MIL/uL (ref 4.22–5.81)
WBC: 13.1 10*3/uL — ABNORMAL HIGH (ref 4.0–10.5)
WBC: 13.8 10*3/uL — ABNORMAL HIGH (ref 4.0–10.5)
WBC: 14.8 10*3/uL — ABNORMAL HIGH (ref 4.0–10.5)
WBC: 15.1 10*3/uL — ABNORMAL HIGH (ref 4.0–10.5)
WBC: 16.3 10*3/uL — ABNORMAL HIGH (ref 4.0–10.5)
WBC: 5.9 10*3/uL (ref 4.0–10.5)

## 2010-05-30 LAB — BASIC METABOLIC PANEL
BUN: 13 mg/dL (ref 6–23)
BUN: 13 mg/dL (ref 6–23)
CO2: 34 mEq/L — ABNORMAL HIGH (ref 19–32)
CO2: 39 mEq/L — ABNORMAL HIGH (ref 19–32)
Calcium: 9 mg/dL (ref 8.4–10.5)
Calcium: 9.2 mg/dL (ref 8.4–10.5)
Chloride: 100 mEq/L (ref 96–112)
Chloride: 104 mEq/L (ref 96–112)
Chloride: 91 mEq/L — ABNORMAL LOW (ref 96–112)
Chloride: 92 mEq/L — ABNORMAL LOW (ref 96–112)
Creatinine, Ser: 0.67 mg/dL (ref 0.4–1.5)
Creatinine, Ser: 0.7 mg/dL (ref 0.4–1.5)
Creatinine, Ser: 0.87 mg/dL (ref 0.4–1.5)
GFR calc Af Amer: >60 mL/min (ref >60)
GFR calc Af Amer: >60 mL/min (ref >60)
GFR calc Af Amer: >60 mL/min (ref >60)
GFR calc non Af Amer: >60 mL/min (ref >60)
Potassium: 3.7 mEq/L (ref 3.5–5.1)
Potassium: 4 mEq/L (ref 3.5–5.1)
Potassium: 4.4 mEq/L (ref 3.5–5.1)
Sodium: 141 mEq/L (ref 135–145)

## 2010-05-30 LAB — DIFFERENTIAL
Basophils Absolute: 0 10*3/uL (ref 0.0–0.1)
Basophils Relative: 0 % (ref 0–1)
Eosinophils Absolute: 0 10*3/uL (ref 0.0–0.7)
Eosinophils Absolute: 0 10*3/uL (ref 0.0–0.7)
Eosinophils Absolute: 0 10*3/uL (ref 0.0–0.7)
Eosinophils Relative: 0 % (ref 0–5)
Eosinophils Relative: 0 % (ref 0–5)
Eosinophils Relative: 0 % (ref 0–5)
Lymphocytes Relative: 17 % (ref 12–46)
Lymphocytes Relative: 4 % — ABNORMAL LOW (ref 12–46)
Lymphs Abs: 0.4 10*3/uL — ABNORMAL LOW (ref 0.7–4.0)
Lymphs Abs: 0.4 10*3/uL — ABNORMAL LOW (ref 0.7–4.0)
Lymphs Abs: 0.4 10*3/uL — ABNORMAL LOW (ref 0.7–4.0)
Lymphs Abs: 0.6 10*3/uL — ABNORMAL LOW (ref 0.7–4.0)
Lymphs Abs: 1.3 10*3/uL (ref 0.7–4.0)
Lymphs Abs: 2.5 10*3/uL (ref 0.7–4.0)
Monocytes Absolute: 0.1 10*3/uL (ref 0.1–1.0)
Monocytes Absolute: 0.4 10*3/uL (ref 0.1–1.0)
Monocytes Relative: 3 % (ref 3–12)
Monocytes Relative: 3 % (ref 3–12)
Monocytes Relative: 3 % (ref 3–12)
Monocytes Relative: 6 % (ref 3–12)
Monocytes Relative: 8 % (ref 3–12)
Neutro Abs: 11.1 10*3/uL — ABNORMAL HIGH (ref 1.7–7.7)
Neutro Abs: 12.1 10*3/uL — ABNORMAL HIGH (ref 1.7–7.7)
Neutro Abs: 15.5 10*3/uL — ABNORMAL HIGH (ref 1.7–7.7)
Neutrophils Relative %: 73 % (ref 43–77)
Neutrophils Relative %: 85 % — ABNORMAL HIGH (ref 43–77)
Neutrophils Relative %: 95 % — ABNORMAL HIGH (ref 43–77)

## 2010-05-30 LAB — HEPATIC FUNCTION PANEL
ALT: 32 U/L (ref 0–53)
AST: 27 U/L (ref 0–37)
Albumin: 3.2 g/dL — ABNORMAL LOW (ref 3.5–5.2)
Indirect Bilirubin: 0.4 mg/dL (ref 0.3–0.9)
Total Protein: 6.4 g/dL (ref 6.0–8.3)

## 2010-05-30 LAB — POCT CARDIAC MARKERS
Troponin i, poc: <0.05 ng/mL (ref 0.00–0.09)
Troponin i, poc: <0.05 ng/mL (ref 0.00–0.09)

## 2010-05-30 LAB — CARDIAC PANEL(CRET KIN+CKTOT+MB+TROPI)
CK, MB: 1.5 ng/mL (ref 0.3–4.0)
CK, MB: 2.7 ng/mL (ref 0.3–4.0)
Total CK: 32 U/L (ref 7–232)
Total CK: 52 U/L (ref 7–232)
Troponin I: 0.03 ng/mL (ref 0.00–0.06)
Troponin I: 0.03 ng/mL (ref 0.00–0.06)

## 2010-05-30 LAB — CULTURE, BLOOD (ROUTINE X 2)
Culture: NO GROWTH
Culture: NO GROWTH

## 2010-05-30 LAB — URINALYSIS, ROUTINE W REFLEX MICROSCOPIC
Glucose, UA: NEGATIVE mg/dL
Specific Gravity, Urine: 1.02 (ref 1.005–1.030)
pH: 5.5 (ref 5.0–8.0)

## 2010-05-30 LAB — GLUCOSE, CAPILLARY
Glucose-Capillary: 115 mg/dL — ABNORMAL HIGH (ref 70–99)
Glucose-Capillary: 118 mg/dL — ABNORMAL HIGH (ref 70–99)
Glucose-Capillary: 120 mg/dL — ABNORMAL HIGH (ref 70–99)
Glucose-Capillary: 123 mg/dL — ABNORMAL HIGH (ref 70–99)
Glucose-Capillary: 128 mg/dL — ABNORMAL HIGH (ref 70–99)
Glucose-Capillary: 129 mg/dL — ABNORMAL HIGH (ref 70–99)
Glucose-Capillary: 149 mg/dL — ABNORMAL HIGH (ref 70–99)
Glucose-Capillary: 89 mg/dL (ref 70–99)

## 2010-05-30 LAB — URINE MICROSCOPIC-ADD ON

## 2010-05-30 LAB — COMPREHENSIVE METABOLIC PANEL
BUN: 11 mg/dL (ref 6–23)
CO2: 29 mEq/L (ref 19–32)
Calcium: 9 mg/dL (ref 8.4–10.5)
Chloride: 99 mEq/L (ref 96–112)
Creatinine, Ser: 0.82 mg/dL (ref 0.4–1.5)
GFR calc non Af Amer: >60 mL/min (ref >60)
Total Bilirubin: 0.5 mg/dL (ref 0.3–1.2)

## 2010-05-30 LAB — HEMOGLOBIN A1C
Hgb A1c MFr Bld: 5.5 % (ref 4.6–6.1)
Mean Plasma Glucose: 111 mg/dL

## 2010-06-21 ENCOUNTER — Emergency Department (HOSPITAL_COMMUNITY)
Admission: EM | Admit: 2010-06-21 | Discharge: 2010-06-21 | Disposition: A | Discharge status: Home / Self Care | Payer: Medicaid Other | Attending: Emergency Medicine | Admitting: Emergency Medicine

## 2010-06-21 DIAGNOSIS — R109 Unspecified abdominal pain: Secondary | ICD-10-CM | POA: Insufficient documentation

## 2010-06-21 DIAGNOSIS — J449 Chronic obstructive pulmonary disease, unspecified: Secondary | ICD-10-CM | POA: Insufficient documentation

## 2010-06-21 DIAGNOSIS — N390 Urinary tract infection, site not specified: Secondary | ICD-10-CM | POA: Insufficient documentation

## 2010-06-21 DIAGNOSIS — G2581 Restless legs syndrome: Secondary | ICD-10-CM | POA: Insufficient documentation

## 2010-06-21 DIAGNOSIS — J4489 Other specified chronic obstructive pulmonary disease: Secondary | ICD-10-CM | POA: Insufficient documentation

## 2010-06-21 LAB — URINALYSIS, ROUTINE W REFLEX MICROSCOPIC
Glucose, UA: NEGATIVE mg/dL
pH: 7 (ref 5.0–8.0)

## 2010-06-21 LAB — URINE MICROSCOPIC-ADD ON

## 2010-06-23 ENCOUNTER — Other Ambulatory Visit (HOSPITAL_COMMUNITY): Payer: Self-pay | Admitting: Urology

## 2010-06-23 DIAGNOSIS — R310 Gross hematuria: Secondary | ICD-10-CM

## 2010-06-23 LAB — URINE CULTURE

## 2010-06-26 ENCOUNTER — Ambulatory Visit (HOSPITAL_COMMUNITY)
Admission: RE | Admit: 2010-06-26 | Discharge: 2010-06-26 | Disposition: A | Discharge status: Home / Self Care | Payer: Medicaid Other | Source: Ambulatory Visit | Attending: Urology | Admitting: Urology

## 2010-06-26 DIAGNOSIS — F17200 Nicotine dependence, unspecified, uncomplicated: Secondary | ICD-10-CM | POA: Insufficient documentation

## 2010-06-26 DIAGNOSIS — J4489 Other specified chronic obstructive pulmonary disease: Secondary | ICD-10-CM | POA: Insufficient documentation

## 2010-06-26 DIAGNOSIS — K5730 Diverticulosis of large intestine without perforation or abscess without bleeding: Secondary | ICD-10-CM | POA: Insufficient documentation

## 2010-06-26 DIAGNOSIS — R310 Gross hematuria: Secondary | ICD-10-CM | POA: Insufficient documentation

## 2010-06-26 DIAGNOSIS — J449 Chronic obstructive pulmonary disease, unspecified: Secondary | ICD-10-CM | POA: Insufficient documentation

## 2010-06-26 MED ORDER — IOHEXOL 300 MG/ML  SOLN
125.0000 mL | Freq: Once | INTRAMUSCULAR | Status: AC | PRN
  Administered 2010-06-26: 125 mL via INTRAVENOUS

## 2010-07-07 NOTE — Discharge Summary (Signed)
Zachary Ford, Zachary Ford            ACCOUNT NO.:  [id number]   MEDICAL RECORD NO.:  [id number]          PATIENT TYPE:  INP   LOCATION:  A325                          FACILITY:  [facility]   PHYSICIAN:  Osvaldo Shipper, MD     DATE OF BIRTH:  Aug 21, 1955   DATE OF ADMISSION:  09/27/2008  DATE OF DISCHARGE:  08/11/2010LH                               DISCHARGE SUMMARY   Please review H and P dictated at the time of admission for details  regarding the patient's presenting illness.   The patient does not have a PMD.   The patient was seen in consultation by Dr. Juanetta Gosling.   Procedures performed include endotracheal intubation with mechanical  ventilation.   DISCHARGE DIAGNOSES:  1. Acute chronic obstructive pulmonary disease exacerbation with      respiratory failure requiring mechanical ventilation, now improved.  2. Tobacco abuse.  3. Abnormal EKG requiring outpatient followup.  4. History of alcohol abuse as well.   BRIEF HOSPITAL COURSE:  Briefly, this is a 55 year old Caucasian male  who presented to the hospital with worsening shortness of breath.  He  was found to be in an acute respiratory failure.  Initially, BiPAP was  initiated.  However, the patient showed no improvement.  His initial pH  was 7.22 with a pCO2 of 99.  With BiPAP, it does improved initially to  pCO2 of 89, however, he then started getting worse.  His mentation also  worsened.  This prompted an endotracheal intubation.  The patient was  intubated for about 36 hours.  Subsequently, he was extubated.  He has  done well since then.  He does have a history of essential tremors,  which have been confused with alcohol withdrawal.  He does drink  alcohol, but not on daily basis and not in significant quantities.   This morning, the patient is feeling better, he is wheezing, but he is  able to ambulate.  He does not have a cough.  Denies any chest pain.  Overall, he feels better and improved.   During the course of this  admission, we also noted that he had abnormal  EKG in the form of T-wave inversions in anterior leads and inferior  leads.  Cardiac enzymes were cycled and they were all negative.  An  echocardiogram was subsequently ordered and this showed a normal left  ventricle EF with normal wall motion.  EKGs were repeated.  It appears  that these EKG findings could reflect his severe lung disease.  However,  this is a 55 year old who does smoke and could have coronary artery  disease.  So, we will arrange outpatient followup with Cardiology.  He  will be asked to take a baby aspirin on a daily basis.  Unfortunately,  lipid panel was not checked during this admission.   Discharge medications are as follows.  1. Aspirin 81 mg daily.  2. Albuterol along with Atrovent nebulizers every 6 hours.  3. Advair 250/50 inhaled b.i.d.  4. Avelox 500 mg once daily for 2 more days.  5. Multivitamins 1 tablet daily.  6. Prednisone 60 mg daily for 4 days,  followed by 40 daily for 4 days;      followed by 20 daily for 4 days; followed by 10 mg daily for 4      days.  7. Nicotine patch 21 mg daily.   The patient was extensively counseled on smoking cessation as well as  alcohol cessation.   He was told that if he develops worsening shortness of breath, or  develops chest pains, he needs to seek attention immediately.   Other pertinent labs done during this admission include HbA1c which was  5.5, TSH was actually low at 0.316.  This could be sick euthyroid,  however, he does require outpatient recheck in a few weeks' time.  Rest  of his labs were within reasonable limits.  He did have transient  hyperglycemia because of steroid use.   Follow up with Dr. Juanetta Gosling in 2 weeks.  He will be made an appointment  with the health department.  The patient and the family will work on  [facility].  We will provide financial assistance with some of his  medications.   DIET:  Heart healthy.   PHYSICAL ACTIVITY:  To  increase activity slowly, avoid straining, avoid  outdoor activities for the next 1 week.  I have also asked him to avoid  being around individuals who smoke.   Total time on this encounter 35 minutes.      Osvaldo Shipper, MD  Electronically Signed     GK/MEDQ  D:  10/02/2008  T:  10/02/2008  Job:  [id number]   cc:   Ramon Dredge L. Juanetta Gosling, M.D.  Fax: [phone]

## 2010-07-07 NOTE — Group Therapy Note (Signed)
NAMEEUSTACIO, ELLEN            ACCOUNT NO.:  [id number]   MEDICAL RECORD NO.:  [id number]          PATIENT TYPE:  INP   LOCATION:  IC04                          FACILITY:  [facility]   PHYSICIAN:  Edward L. Juanetta Gosling, M.D.DATE OF BIRTH:  August 07, 1955   DATE OF PROCEDURE:  DATE OF DISCHARGE:                                 PROGRESS NOTE   Mr. Limes remains intubated, sedated, and on the ventilator.  However, he does seem to be improving.  He is more alert, but it is not  clear that he is going to be able to cooperative enough to come off from  the ventilator.  No new problems have been noted overnight.   His physical examination shows that he is awake and alert, but sedated.  His chest is much clear.  His heart is regular.  His abdomen is soft.  His extremities showed no edema.  His lab work shows that his blood  gases are okay.  He still has some elevation of pCO2.   ASSESSMENT:  He may be ready for weaning, but will plan to see how he  does with that and then decide.  I do not plan to change anything else.      Edward L. Juanetta Gosling, M.D.  Electronically Signed     ELH/MEDQ  D:  09/29/2008  T:  09/30/2008  Job:  [id number]

## 2010-07-07 NOTE — H&P (Signed)
Zachary Ford, Zachary Ford            ACCOUNT NO.:  [id number]   MEDICAL RECORD NO.:  [id number]          PATIENT TYPE:  INP   LOCATION:  IC04                          FACILITY:  [facility]   PHYSICIAN:  Della Goo, M.D. DATE OF BIRTH:  Apr 29, 1955   DATE OF ADMISSION:  09/27/2008  DATE OF DISCHARGE:  LH                              HISTORY & PHYSICAL   DATE OF ADMISSION:  September 27, 2008.   PRIMARY CARE PHYSICIAN:  None.   CHIEF COMPLAINT:  Shortness of breath.   HISTORY OF PRESENT ILLNESS:  This is a 55 year old male who was brought  to the emergency department by his sister secondary to complaints of  worsening shortness of breath over the past 4-5 days.  He states that  today it had worsened to the point where he has been unable to speak.  The patient also has had some confusion as well per report of his sister  who is at the bedside.  The patient also has had cough productive of  yellowish sputum.  He denies having any fevers or chills.  The patient  is a heavy smoker.  He reports smoking two packs of cigarettes daily for  30+ years.  He denies having chest pain but does report having numbness  in both of his arms during this episode.  He also reports that his right  arm began to shake.   PAST MEDICAL HISTORY:  No previous medical problems per his report.   MEDICATIONS:  None.   PAST SURGICAL HISTORY:  None.   ALLERGIES:  NO KNOWN DRUG ALLERGIES.   SOCIAL HISTORY:  The patient is a smoker.  He reports smoking two packs  of cigarettes daily for 30+ years.  He reports occasional alcohol usage  but reports not drinking in the past week because he has been so short  of breath.  He denies any illicit drug usage.   FAMILY HISTORY:  Positive for coronary artery disease, hypertension and  diabetes in his mother.  His mother also had pancreatic cancer and is  deceased.   REVIEW OF SYSTEMS:  Pertinents are mentioned in the HPI.   PHYSICAL EXAMINATION FINDINGS:  GENERAL:  This is  a 55 year old older  than stated age appearing, well-developed male in mild distress.  VITAL SIGNS:  His temperature is 98.5, blood pressure 128/104 initially,  heart rate 111, respirations 26, O2 saturations 90%.  He is currently  90% on arrival.  It is now on 98% on BiPAP.  HEENT:  Normocephalic, atraumatic.  Pupils are equally round, reactive  to light bilaterally.  Extraocular movements are intact.  Funduscopic  benign.  Nares are patent bilaterally.  Oropharynx is clear.  BiPAP mask  fitting over the nose and mouth at this time.  NECK:  Supple, full range  of motion.  No thyromegaly, adenopathy or jugular venous distention.  CARDIOVASCULAR:  Regular rate and rhythm.  No murmurs, gallops or rubs.  LUNGS:  Clear to auscultation but decreased bilaterally.  There are no  expiratory wheezes, rales or rhonchi appreciated.  ABDOMEN: Positive bowel sounds, soft, nontender, nondistended.  No  hepatosplenomegaly.  EXTREMITIES:  Without cyanosis, clubbing or edema.  NEUROLOGIC:  The patient is alert and oriented x3.  He is able to move  all four of his extremities.  There are no deficits of his motor and  sensory function.   LABORATORY STUDIES:  White blood cell count 15.1, hemoglobin 17.0,  hematocrit 51.5, platelets 264, neutrophils 73%, lymphocytes 17%.  Sodium 138, potassium 4.8, chloride 91, carbon dioxide 41, BUN 6,  creatinine 0.87 and calcium 9.2.  Urinalysis reveals moderate urine  hemoglobin, small leukocyte esterase.  Urine microscopic reveals  Trichomonas.  Point-of-care cardiac markers reveal a myoglobin of 34.3,  CK-MB 2.2, troponin less than 0.05.  Arterial blood gas performed on 10  liters of nasal cannula oxygen revealing a pH of 7.227, pCO2 99.1, pO2  187.0, bicarb 39.7 and O2 saturation 99.1%.  The patient was placed on  BiPAP therapy and a repeat blood gas was also performed after 45 minutes  on the BiPAP which did show improvement.  EKG reveals a normal sinus  rhythm.   There are ischemic changes in the inferior leads that are seen.  Also T-wave inversions seen in the anterior leads and, compared to  previous EKG, these anteriorly changes were present before.   ASSESSMENT:  A 55 year old male being admitted with:  1. Shortness of breath.  2. Chronic obstructive pulmonary disease exacerbation.  3. Tobacco abuse.  4. Trichomonas urethritis.   PLAN:  The patient will be admitted to the ICU area.  He will continue  on BiPAP therapy.  An IV steroid taper will be initiated and nebulizer  treatments with albuterol and Atrovent therapy.  Cardiac enzymes will  also be performed and the patient will be placed on nitropaste therapy  until his enzymes have ruled in or ruled out.  The patient will also be  administered aspirin therapy.  DVT and GI prophylaxis has also been  ordered.  The patient will be placed on a nicotine patch at this time.  Smoking cessation has been discussed and further counseling through the  hospital services has been requested.  The patient will also be  administered single-dose treatment for his Trichomonas infection.  He  has been made aware of this infection and that his sexual exposures will  need to be treated.      Della Goo, M.D.  Electronically Signed     HJ/MEDQ  D:  09/27/2008  T:  09/27/2008  Job:  [id number]

## 2010-07-07 NOTE — Group Therapy Note (Signed)
Zachary Ford, Zachary Ford            ACCOUNT NO.:  [id number]   MEDICAL RECORD NO.:  [id number]         PATIENT TYPE:  PINP   LOCATION:  IC04                          FACILITY:  [facility]   PHYSICIAN:  Edward L. Juanetta Gosling, M.D.DATE OF BIRTH:  Apr 26, 1955   DATE OF PROCEDURE:  09/30/2008  DATE OF DISCHARGE:                                 PROGRESS NOTE   SUBJECTIVE:  Ms. Fronczak seems to be doing better.  He has been  extubated and has tolerated that well.  He says that he has been worse  with his breathing for the last several weeks.  Even at his best now, he  can only walk about 20-30 feet before he is dyspneic.   Exam this morning shows that he is awake and alert.  His pulse is in the  90s, blood pressure 130/70.  Chest with rhonchi bilaterally but no  wheezes.  His heart is regular without gallop.  His abdomen is soft, and  he does not have any edema.   His laboratory work, white count 16,300, hemoglobin 14.7, platelets 237.  Blood gas on 1 L, pH 7.40, pCO2 of 51, pO2 of 76, and his BMET shows his  CO2 is 35, glucose of 130, BUN is 13, creatinine 0.67.   Assessment is that he has acute on chronic respiratory failure.  He has  severe COPD.  He is improved and off the ventilator, and plan then is to  continue with medications and treatments.      Edward L. Juanetta Gosling, M.D.  Electronically Signed     ELH/MEDQ  D:  09/30/2008  T:  09/30/2008  Job:  [id number]

## 2010-07-07 NOTE — Consult Note (Signed)
NAMEKAMEN, Zachary Ford            ACCOUNT NO.:  [id number]   MEDICAL RECORD NO.:  [id number]          PATIENT TYPE:  INP   LOCATION:  IC04                          FACILITY:  [facility]   PHYSICIAN:  Edward L. Juanetta Gosling, M.D.DATE OF BIRTH:  09-30-1955   DATE OF CONSULTATION:  DATE OF DISCHARGE:                                 CONSULTATION   REASON FOR CONSULTATION:  Respiratory failure.   HISTORY OF PRESENT ILLNESS:  Zachary Ford is a 55 year old who has a  long known history of COPD.  His sister who provides most of the history  says that he is afraid of hospitalist, but has had increasing shortness  of breath for a week or two.  He has told her that he is afraid to go to  sleep because he is afraid he will not wake up.  He got so short of  breath on the day of admission that he was not able to finish a sentence  and he eventually presented to the emergency room.  He initially was  placed on BiPAP and then did not improve and was intubated and now is on  mechanical ventilation.  She says that he had a similar episode about a  year ago, but he was not admitted here for that.   His past medical history is positive for COPD.  His sister has been told  that he has asthma and bronchitis.   He has not had any surgery.   His family history shows that he is positive for coronary disease,  hypertension, and diabetes.  His mother is deceased from pancreatic  cancer.   SOCIAL HISTORY:  He smokes about 2 packs of cigarettes daily for 30+  years.  He is a binged drinker.  He does not use any illicit drugs.  He has not been using any alcohol recently because according to his  sister he has been too short of breath.   Review of systems is that he has not had any fever known.  He has not  had any chills.  He denied any chest pain.   PHYSICAL EXAMINATION:  GENERAL:  He is intubated, on the ventilator, and  sedated.  VITAL SIGNS:  His pulse in the 90s, blood pressure 100/70.  HEENT:  His pupils do  react.  Mucous membranes are mildly dry.  He does  have an endotracheal tube in place.  His nose and throat are clear.  CHEST:  Decreased breath sounds and end-expiratory wheezes.  HEART:  Regular without gallop.  ABDOMEN:  Soft.  I do not feel any masses.  EXTREMITIES:  No edema.  CENTRAL NERVOUS SYSTEM:  Grossly intact.   Blood gas this morning after being intubated on 50%, 500, rate of 16, 5  of PEEP, pH 7.35, pCO2 of 62, pO2 of 127.  CBC shows white count 5900,  hemoglobin 16, platelets 189.  BMET shows the CO2 is 39 suggesting that  he has chronic CO2 retention.  Chest x-ray, no definite pneumonia.   ASSESSMENT:  He has chronic obstructive pulmonary disease exacerbation  resulting in respiratory failure.  He has subsequently been intubated  on  a ventilator.  He is on Rocephin and Zithromax nebulizer treatments.  He  is on steroids, but I would probably increase the dose.  I will continue  with his sedation.  I do not think he is going to be able to come off  the ventilator today.  He was just intubated early yesterday and then I  would plan to follow from there.   Thanks for allowing me to see him with you.      Edward L. Juanetta Gosling, M.D.  Electronically Signed     ELH/MEDQ  D:  09/28/2008  T:  09/28/2008  Job:  [id number]

## 2010-07-07 NOTE — Group Therapy Note (Signed)
NAMEELAD, MACPHAIL            ACCOUNT NO.:  [id number]   MEDICAL RECORD NO.:  [id number]          PATIENT TYPE:  INP   LOCATION:  IC04                          FACILITY:  [facility]   PHYSICIAN:  Edward L. Juanetta Gosling, M.D.DATE OF BIRTH:  1956/01/09   DATE OF PROCEDURE:  DATE OF DISCHARGE:                                 PROGRESS NOTE   Zachary Ford is better.  He is much more awake and alert.  He does not  seem as short of breath.  He does not seem to be having as much trouble.  He says he feels much better in general.  He is not coughing much.  He  is still short of breath with exertion.   His exam shows that his chest shows decreased breath sounds.  His heart  is regular without gallop.  Blood pressure in the 130s systolic, pulses  in the 80s, and his O2 sats in the 90s.   My assessment then is that he has severe chronic obstructive pulmonary  disease with acute on chronic respiratory failure, requiring ventilator  support.  I discussed smoking cessation with him today and he says he  thinks he has access to some nicotine patches that a friend had and is  not using.  I have encouraged him to try smoking cessation.  His blood  gas this morning on 2 L shows pH 7.35, PCO2 of 57, PO2 of 87, so clearly  he has chronic respiratory failure.  Assessment then is that he has  severe end-stage chronic obstructive pulmonary disease with acute on  chronic respiratory failure, continued tobacco abuse.   PLAN:  He is to continue his meds and treatments.  He is going to need  extensive treatment with inhalers, etc. as an outpatient which he has  not been doing and then hopefully if he can stop smoking, he can improve  somewhat.  I am going to plan to sign off at this point.   Thank you very much for allowing me to see him with you.  I will be glad  to see him in my office as an outpatient if desired.      Edward L. Juanetta Gosling, M.D.  Electronically Signed     ELH/MEDQ  D:  10/01/2008  T:   10/01/2008  Job:  [id number]

## 2010-12-09 LAB — CBC
HCT: 48.1
Hemoglobin: 17.2 — ABNORMAL HIGH
MCHC: 35.7
MCV: 87.6
RBC: 5.49
WBC: 14.9 — ABNORMAL HIGH

## 2010-12-09 LAB — COMPREHENSIVE METABOLIC PANEL
AST: 28
CO2: 27
Chloride: 103
Creatinine, Ser: 0.85
GFR calc Af Amer: >60
GFR calc non Af Amer: >60
Glucose, Bld: 111 — ABNORMAL HIGH
Total Bilirubin: 0.6

## 2010-12-09 LAB — URINALYSIS, ROUTINE W REFLEX MICROSCOPIC
Bilirubin Urine: NEGATIVE
Glucose, UA: NEGATIVE
Specific Gravity, Urine: >1.03 — ABNORMAL HIGH

## 2010-12-09 LAB — RAPID URINE DRUG SCREEN, HOSP PERFORMED: Cocaine: NOT DETECTED

## 2010-12-09 LAB — DIFFERENTIAL
Blasts: 0
Metamyelocytes Relative: 0
Monocytes Relative: 0 — ABNORMAL LOW
Myelocytes: 0
Neutrophils Relative %: 69
nRBC: 0

## 2010-12-09 LAB — URINE MICROSCOPIC-ADD ON

## 2010-12-09 LAB — POCT CARDIAC MARKERS: Myoglobin, poc: 32.4

## 2011-02-12 ENCOUNTER — Encounter: Payer: Self-pay | Admitting: Cardiology

## 2011-03-16 ENCOUNTER — Encounter (HOSPITAL_COMMUNITY): Payer: Self-pay | Admitting: Emergency Medicine

## 2011-03-16 ENCOUNTER — Emergency Department (HOSPITAL_COMMUNITY)
Admission: EM | Admit: 2011-03-16 | Discharge: 2011-03-16 | Disposition: A | Discharge status: Home / Self Care | Payer: Medicaid Other | Attending: Emergency Medicine | Admitting: Emergency Medicine

## 2011-03-16 DIAGNOSIS — Z800 Family history of malignant neoplasm of digestive organs: Secondary | ICD-10-CM | POA: Insufficient documentation

## 2011-03-16 DIAGNOSIS — E785 Hyperlipidemia, unspecified: Secondary | ICD-10-CM | POA: Insufficient documentation

## 2011-03-16 DIAGNOSIS — L0291 Cutaneous abscess, unspecified: Secondary | ICD-10-CM

## 2011-03-16 DIAGNOSIS — F1021 Alcohol dependence, in remission: Secondary | ICD-10-CM | POA: Insufficient documentation

## 2011-03-16 DIAGNOSIS — I10 Essential (primary) hypertension: Secondary | ICD-10-CM | POA: Insufficient documentation

## 2011-03-16 DIAGNOSIS — N498 Inflammatory disorders of other specified male genital organs: Secondary | ICD-10-CM | POA: Insufficient documentation

## 2011-03-16 DIAGNOSIS — Z7982 Long term (current) use of aspirin: Secondary | ICD-10-CM | POA: Insufficient documentation

## 2011-03-16 DIAGNOSIS — J449 Chronic obstructive pulmonary disease, unspecified: Secondary | ICD-10-CM | POA: Insufficient documentation

## 2011-03-16 DIAGNOSIS — J4489 Other specified chronic obstructive pulmonary disease: Secondary | ICD-10-CM | POA: Insufficient documentation

## 2011-03-16 DIAGNOSIS — Z9981 Dependence on supplemental oxygen: Secondary | ICD-10-CM | POA: Insufficient documentation

## 2011-03-16 DIAGNOSIS — F17200 Nicotine dependence, unspecified, uncomplicated: Secondary | ICD-10-CM | POA: Insufficient documentation

## 2011-03-16 MED ORDER — DOXYCYCLINE HYCLATE 100 MG PO TABS
100.0000 mg | ORAL_TABLET | Freq: Once | ORAL | Status: AC
  Administered 2011-03-16: 100 mg via ORAL
  Filled 2011-03-16: qty 1

## 2011-03-16 MED ORDER — IPRATROPIUM BROMIDE 0.02 % IN SOLN
0.5000 mg | Freq: Once | RESPIRATORY_TRACT | Status: AC
  Administered 2011-03-16: 0.5 mg via RESPIRATORY_TRACT
  Filled 2011-03-16: qty 2.5

## 2011-03-16 MED ORDER — ALBUTEROL SULFATE (5 MG/ML) 0.5% IN NEBU
5.0000 mg | INHALATION_SOLUTION | Freq: Once | RESPIRATORY_TRACT | Status: AC
  Administered 2011-03-16: 5 mg via RESPIRATORY_TRACT
  Filled 2011-03-16: qty 1

## 2011-03-16 MED ORDER — DOXYCYCLINE HYCLATE 100 MG PO CAPS: 100.0000 mg | ORAL_CAPSULE | Freq: Two times a day (BID) | ORAL | Status: AC

## 2011-03-16 NOTE — ED Notes (Signed)
Pt states he had an abscess drained and it is still draining. Pt also c/o sob.

## 2011-03-16 NOTE — ED Provider Notes (Signed)
History   This chart was scribed for Zachary Gaskins, MD by Zachary Ford. The patient was seen in room APA08/APA08 and the patient's care was started at 3:18PM.   CSN: [id number]  Arrival date & time 03/16/11  1451   First MD Initiated Contact with Patient 03/16/11 1502      Chief Complaint  Patient presents with  . Abscess  . Shortness of Breath     HPI Zachary Ford is a 56 y.o. male who presents to the Emergency Department complaining of intermittent drainage from abscess located to scrotal region onset 1 year ago and persistent since. Patient states he was evaluated for abscess 1 year ago and had incision and drainage performed with packing placed and notes abscess has drained intermittently over the past year. States most recent episode of drainage began 3 days ago and has been persistent since. Additionally, patient c/o constant moderate to severe SOB onset several days ago and persistent since with associated cough with blood tinged sputum, wheezing, subjective fever and chest soreness with coughing. Denies vomiting and diarrhea. Patient with h/o COPD and HLD. Patient is a current smoker.  PCP-[facility]  Past Medical History  Diagnosis Date  . COPD (chronic obstructive pulmonary disease)   . Abnormal electrocardiogram   . History of trichomonal urethritis   . Abdominal pain   . Bloating   . Sleep disorder   . Fasting hyperglycemia   . Borderline hypertension   . Hyperlipidemia   . Tobacco abuse     History reviewed. No pertinent past surgical history.  Family History  Problem Relation Age of Onset  . Pancreatic cancer Mother   . Coronary artery disease Mother   . Abnormal EKG Mother   . Hypertension Mother   . Arrhythmia Father   . Colon cancer Neg Hx   . Colon polyps Neg Hx     History  Substance Use Topics  . Smoking status: Current Everyday Smoker -- 0.3 packs/day  . Smokeless tobacco: Not on file   Comment: 70 pack years, now one pack every 3 days    . Alcohol Use: No     Quit 2010, previously 6 pk or more per night      Review of Systems 10 Systems reviewed and are negative for acute change except as noted in the HPI.  Allergies  Review of patient's allergies indicates not on file.  Home Medications   Current Outpatient Rx  Name Route Sig Dispense Refill  . ALBUTEROL SULFATE HFA 108 (90 BASE) MCG/ACT IN AERS Inhalation Inhale 2 puffs into the lungs as directed.      . ALBUTEROL SULFATE (2.5 MG/3ML) 0.083% IN NEBU Nebulization Take 2.5 mg by nebulization.      . ALBUTEROL SULFATE (5 MG/ML) 0.5% IN NEBU Nebulization Take 2.5 mg by nebulization.      Marland Kitchen PROVENTIL HFA IN Inhalation Inhale into the lungs.      Maximino [place] 18-103 MCG/ACT IN AERO Inhalation Inhale 2 puffs into the lungs as needed.      . ASPIRIN 81 MG PO TABS Oral Take 81 mg by mouth daily.      Marland Kitchen FLUTICASONE-SALMETEROL 250-50 MCG/DOSE IN AEPB Inhalation Inhale 1 puff into the lungs.      . IPRATROPIUM BROMIDE 0.06 % NA SOLN Nasal Place 2 sprays into the nose as needed.      Marland Kitchen ONE-DAILY MULTI VITAMINS PO TABS Oral Take 1 tablet by mouth daily.      . OXYCODONE-ACETAMINOPHEN 5-325  MG PO TABS Oral Take 1 tablet by mouth every 4 (four) hours as needed.      Marland Kitchen ROPINIROLE HCL 1 MG PO TABS Oral Take 1 mg by mouth daily.        BP 140/72  Pulse 111  Temp(Src) 98.9 F (37.2 C) (Oral)  Resp 22  Ht 5\' 5"  (1.651 m)  Wt 204 lb (92.534 kg)  BMI 33.95 kg/m2  SpO2 92%  Physical Exam CONSTITUTIONAL: Well developed/well nourished HEAD AND FACE: Normocephalic/atraumatic EYES: EOMI/PERRL ENMT: Mucous membranes moist NECK: supple no meningeal signs CV: S1/S2 noted, no murmurs/rubs/gallops noted, tachycardic LUNGS: no apparent distress, wheezing throughout all fields ABDOMEN: soft, nontender, no rebound or guarding, bowel sounds normal, palpable hernia noted GU: chaperone present for exam, lower edge of scrotum with small non-flucutant nodule, no crepitance, no  drainage noted, testicles non-tender bilaterally. The erythema does not extend into perineum NEURO: Pt is awake/alert, moves all extremitiesx4 EXTREMITIES: pulses normal, full ROM SKIN: warm, color normal PSYCH: no abnormalities of mood noted  ED Course  Procedures  DIAGNOSTIC STUDIES: Oxygen Saturation is 92% on room air, low by my interpretation.    COORDINATION OF CARE: 3:24PM-Patient recommended follow-up with surgeon regarding persistent recurrence of abscess. Patient also informed of current treatment plan and intent to administer breathing treatments within ED. Patient agrees with plan.  4:27PM- Patient reports SOB improved following administration of breathing treatments in ED. Informed of intent to d/c home. Patient agrees with plan set forth at this time.  He has home O2, and feels at his baseline Will defer prednisone as he has early abscess, needs surgical followup   MDM  Nursing notes reviewed and considered in documentation       I personally performed the services described in this documentation, which was scribed in my presence. The recorded information has been reviewed and considered.    Zachary Gaskins, MD 03/16/11 [phone]

## 2011-03-18 ENCOUNTER — Encounter (HOSPITAL_COMMUNITY): Payer: Self-pay | Admitting: Emergency Medicine

## 2011-03-18 ENCOUNTER — Inpatient Hospital Stay (HOSPITAL_COMMUNITY)
Admission: EM | Admit: 2011-03-18 | Discharge: 2011-03-19 | DRG: 192 | Disposition: A | Discharge status: Home Health | Payer: Medicaid Other | Attending: Pulmonary Disease | Admitting: Pulmonary Disease

## 2011-03-18 ENCOUNTER — Emergency Department (HOSPITAL_COMMUNITY): Payer: Medicaid Other

## 2011-03-18 DIAGNOSIS — R0689 Other abnormalities of breathing: Secondary | ICD-10-CM

## 2011-03-18 DIAGNOSIS — M545 Low back pain, unspecified: Secondary | ICD-10-CM | POA: Diagnosis present

## 2011-03-18 DIAGNOSIS — J441 Chronic obstructive pulmonary disease with (acute) exacerbation: Principal | ICD-10-CM | POA: Diagnosis present

## 2011-03-18 DIAGNOSIS — R0603 Acute respiratory distress: Secondary | ICD-10-CM

## 2011-03-18 DIAGNOSIS — I10 Essential (primary) hypertension: Secondary | ICD-10-CM | POA: Diagnosis present

## 2011-03-18 DIAGNOSIS — J449 Chronic obstructive pulmonary disease, unspecified: Secondary | ICD-10-CM | POA: Diagnosis present

## 2011-03-18 DIAGNOSIS — E785 Hyperlipidemia, unspecified: Secondary | ICD-10-CM | POA: Diagnosis present

## 2011-03-18 DIAGNOSIS — Z79899 Other long term (current) drug therapy: Secondary | ICD-10-CM

## 2011-03-18 DIAGNOSIS — F17200 Nicotine dependence, unspecified, uncomplicated: Secondary | ICD-10-CM | POA: Diagnosis present

## 2011-03-18 DIAGNOSIS — J4489 Other specified chronic obstructive pulmonary disease: Secondary | ICD-10-CM | POA: Diagnosis present

## 2011-03-18 DIAGNOSIS — J40 Bronchitis, not specified as acute or chronic: Secondary | ICD-10-CM

## 2011-03-18 DIAGNOSIS — Z7982 Long term (current) use of aspirin: Secondary | ICD-10-CM

## 2011-03-18 DIAGNOSIS — G8929 Other chronic pain: Secondary | ICD-10-CM | POA: Diagnosis present

## 2011-03-18 LAB — CBC
HCT: 44.5 % (ref 39.0–52.0)
Hemoglobin: 13.6 g/dL (ref 13.0–17.0)
MCH: 27.7 pg (ref 26.0–34.0)
MCHC: 30.6 g/dL (ref 30.0–36.0)
MCV: 90.6 fL (ref 78.0–100.0)
Platelets: 260 K/uL (ref 150–400)
RBC: 4.91 MIL/uL (ref 4.22–5.81)
RDW: 12.8 % (ref 11.5–15.5)
WBC: 10.1 10*3/uL (ref 4.0–10.5)

## 2011-03-18 LAB — DIFFERENTIAL
Basophils Absolute: 0.1 K/uL (ref 0.0–0.1)
Basophils Relative: 1 % (ref 0–1)
Eosinophils Absolute: 0.3 K/uL (ref 0.0–0.7)
Eosinophils Relative: 3 % (ref 0–5)
Lymphocytes Relative: 21 % (ref 12–46)
Lymphs Abs: 2.1 10*3/uL (ref 0.7–4.0)
Monocytes Absolute: 1.3 10*3/uL — ABNORMAL HIGH (ref 0.1–1.0)
Monocytes Relative: 13 % — ABNORMAL HIGH (ref 3–12)
Neutro Abs: 6.4 K/uL (ref 1.7–7.7)
Neutrophils Relative %: 63 % (ref 43–77)

## 2011-03-18 LAB — BASIC METABOLIC PANEL WITH GFR
BUN: 7 mg/dL (ref 6–23)
Chloride: 93 meq/L — ABNORMAL LOW (ref 96–112)
GFR calc Af Amer: >90 mL/min (ref >90)
GFR calc non Af Amer: >90 mL/min (ref >90)
Potassium: 4.2 meq/L (ref 3.5–5.1)
Sodium: 140 meq/L (ref 135–145)

## 2011-03-18 LAB — BASIC METABOLIC PANEL
CO2: 42 mEq/L (ref 19–32)
Calcium: 9.8 mg/dL (ref 8.4–10.5)
Creatinine, Ser: 0.73 mg/dL (ref 0.50–1.35)
Glucose, Bld: 143 mg/dL — ABNORMAL HIGH (ref 70–99)

## 2011-03-18 MED ORDER — ALBUTEROL SULFATE (5 MG/ML) 0.5% IN NEBU
5.0000 mg | INHALATION_SOLUTION | Freq: Once | RESPIRATORY_TRACT | Status: AC
  Administered 2011-03-18: 2.5 mg via RESPIRATORY_TRACT
  Filled 2011-03-18 (×2): qty 0.5

## 2011-03-18 MED ORDER — DOCUSATE SODIUM 100 MG PO CAPS
100.0000 mg | ORAL_CAPSULE | Freq: Two times a day (BID) | ORAL | Status: DC
  Administered 2011-03-18 (×2): 100 mg via ORAL
  Filled 2011-03-18 (×2): qty 1

## 2011-03-18 MED ORDER — ALBUTEROL SULFATE (5 MG/ML) 0.5% IN NEBU: 2.5000 mg | INHALATION_SOLUTION | Freq: Four times a day (QID) | RESPIRATORY_TRACT | Status: DC

## 2011-03-18 MED ORDER — ALBUTEROL SULFATE (5 MG/ML) 0.5% IN NEBU
5.0000 mg | INHALATION_SOLUTION | RESPIRATORY_TRACT | Status: DC
  Administered 2011-03-18 – 2011-03-19 (×2): 5 mg via RESPIRATORY_TRACT
  Filled 2011-03-18: qty 1

## 2011-03-18 MED ORDER — SODIUM CHLORIDE 0.9 % IV SOLN
INTRAVENOUS | Status: DC
  Administered 2011-03-18 – 2011-03-19 (×3): via INTRAVENOUS

## 2011-03-18 MED ORDER — MOXIFLOXACIN HCL IN NACL 400 MG/250ML IV SOLN
400.0000 mg | INTRAVENOUS | Status: DC
  Administered 2011-03-18: 400 mg via INTRAVENOUS
  Filled 2011-03-18: qty 250

## 2011-03-18 MED ORDER — IPRATROPIUM BROMIDE 0.02 % IN SOLN
0.5000 mg | RESPIRATORY_TRACT | Status: DC
  Administered 2011-03-18 – 2011-03-19 (×2): 0.5 mg via RESPIRATORY_TRACT
  Filled 2011-03-18: qty 2.5

## 2011-03-18 MED ORDER — ONDANSETRON HCL 4 MG PO TABS: 4.0000 mg | ORAL_TABLET | Freq: Four times a day (QID) | ORAL | Status: DC | PRN

## 2011-03-18 MED ORDER — PREDNISONE 20 MG PO TABS
60.0000 mg | ORAL_TABLET | Freq: Once | ORAL | Status: AC
  Administered 2011-03-18: 60 mg via ORAL
  Filled 2011-03-18: qty 3

## 2011-03-18 MED ORDER — ACETAMINOPHEN 325 MG PO TABS
650.0000 mg | ORAL_TABLET | Freq: Four times a day (QID) | ORAL | Status: DC | PRN
  Administered 2011-03-19: 650 mg via ORAL
  Filled 2011-03-18: qty 2

## 2011-03-18 MED ORDER — PANTOPRAZOLE SODIUM 40 MG PO TBEC
40.0000 mg | DELAYED_RELEASE_TABLET | Freq: Every day | ORAL | Status: DC
  Administered 2011-03-18: 40 mg via ORAL
  Filled 2011-03-18: qty 1

## 2011-03-18 MED ORDER — OXYCODONE-ACETAMINOPHEN 5-325 MG PO TABS
1.0000 | ORAL_TABLET | Freq: Four times a day (QID) | ORAL | Status: DC
  Administered 2011-03-18 (×3): 1 via ORAL
  Filled 2011-03-18 (×3): qty 1

## 2011-03-18 MED ORDER — ONDANSETRON HCL 4 MG/2ML IJ SOLN: 4.0000 mg | Freq: Four times a day (QID) | INTRAMUSCULAR | Status: DC | PRN

## 2011-03-18 MED ORDER — SALINE SPRAY 0.65 % NA SOLN
1.0000 | NASAL | Status: DC | PRN
  Filled 2011-03-18: qty 44

## 2011-03-18 MED ORDER — ALUM & MAG HYDROXIDE-SIMETH 200-200-20 MG/5ML PO SUSP: 30.0000 mL | Freq: Four times a day (QID) | ORAL | Status: DC | PRN

## 2011-03-18 MED ORDER — IPRATROPIUM-ALBUTEROL 18-103 MCG/ACT IN AERO: 2.0000 | INHALATION_SPRAY | Freq: Four times a day (QID) | RESPIRATORY_TRACT | Status: DC | PRN

## 2011-03-18 MED ORDER — IPRATROPIUM BROMIDE 0.02 % IN SOLN
0.5000 mg | Freq: Once | RESPIRATORY_TRACT | Status: AC
  Administered 2011-03-18: 0.5 mg via RESPIRATORY_TRACT
  Filled 2011-03-18: qty 2.5

## 2011-03-18 MED ORDER — METHYLPREDNISOLONE SODIUM SUCC 125 MG IJ SOLR
125.0000 mg | Freq: Four times a day (QID) | INTRAMUSCULAR | Status: DC
  Administered 2011-03-18 – 2011-03-19 (×3): 125 mg via INTRAVENOUS
  Filled 2011-03-18 (×3): qty 2

## 2011-03-18 MED ORDER — ASPIRIN EC 81 MG PO TBEC
81.0000 mg | DELAYED_RELEASE_TABLET | Freq: Every day | ORAL | Status: DC
  Administered 2011-03-18: 81 mg via ORAL
  Filled 2011-03-18: qty 1

## 2011-03-18 MED ORDER — GUAIFENESIN ER 600 MG PO TB12
600.0000 mg | ORAL_TABLET | Freq: Two times a day (BID) | ORAL | Status: DC
  Administered 2011-03-18 (×2): 600 mg via ORAL
  Filled 2011-03-18 (×2): qty 1

## 2011-03-18 MED ORDER — FLUTICASONE-SALMETEROL 250-50 MCG/DOSE IN AEPB
1.0000 | INHALATION_SPRAY | Freq: Two times a day (BID) | RESPIRATORY_TRACT | Status: DC
  Administered 2011-03-18 – 2011-03-19 (×2): 1 via RESPIRATORY_TRACT
  Filled 2011-03-18: qty 14

## 2011-03-18 MED ORDER — ENOXAPARIN SODIUM 40 MG/0.4ML ~~LOC~~ SOLN
40.0000 mg | Freq: Every day | SUBCUTANEOUS | Status: DC
  Administered 2011-03-18: 40 mg via SUBCUTANEOUS
  Filled 2011-03-18: qty 0.4

## 2011-03-18 MED ORDER — ACETAMINOPHEN 650 MG RE SUPP: 650.0000 mg | Freq: Four times a day (QID) | RECTAL | Status: DC | PRN

## 2011-03-18 MED ORDER — IPRATROPIUM BROMIDE 0.02 % IN SOLN
0.5000 mg | Freq: Four times a day (QID) | RESPIRATORY_TRACT | Status: DC
  Administered 2011-03-18: 0.5 mg via RESPIRATORY_TRACT
  Filled 2011-03-18: qty 2.5

## 2011-03-18 MED ORDER — ALBUTEROL SULFATE (5 MG/ML) 0.5% IN NEBU
5.0000 mg | INHALATION_SOLUTION | Freq: Four times a day (QID) | RESPIRATORY_TRACT | Status: DC
  Administered 2011-03-18: 5 mg via RESPIRATORY_TRACT
  Administered 2011-03-18: 2.5 mg via RESPIRATORY_TRACT
  Filled 2011-03-18 (×3): qty 0.5

## 2011-03-18 MED ORDER — ALBUTEROL SULFATE (5 MG/ML) 0.5% IN NEBU
2.5000 mg | INHALATION_SOLUTION | Freq: Once | RESPIRATORY_TRACT | Status: AC
  Administered 2011-03-18: 2.5 mg via RESPIRATORY_TRACT
  Filled 2011-03-18: qty 0.5

## 2011-03-18 MED ORDER — SODIUM CHLORIDE 0.9 % IV SOLN
Freq: Once | INTRAVENOUS | Status: AC
  Administered 2011-03-18: 12:00:00 via INTRAVENOUS

## 2011-03-18 MED ORDER — IPRATROPIUM BROMIDE 0.02 % IN SOLN
0.5000 mg | Freq: Once | RESPIRATORY_TRACT | Status: AC
  Administered 2011-03-18: 0.5 mg via RESPIRATORY_TRACT
  Filled 2011-03-18 (×2): qty 2.5

## 2011-03-18 MED ORDER — ROPINIROLE HCL 1 MG PO TABS
1.0000 mg | ORAL_TABLET | Freq: Every day | ORAL | Status: DC
  Administered 2011-03-18: 1 mg via ORAL
  Filled 2011-03-18: qty 1

## 2011-03-18 NOTE — ED Notes (Signed)
Placed pt on O2 at 2l/min.  Pulse ox now 97%.

## 2011-03-18 NOTE — Plan of Care (Signed)
Problem: Phase II Progression Outcomes Goal: O2 sats > equal to 90% on RA or at baseline Outcome: Progressing On 2l/[place] Goal: Pain controlled on oral analgesia Outcome: Progressing Hx of back pain, hx of arthiritis Goal: Discharge plan remains appropriate-arrangements made Outcome: Completed/Met Date Met:  03/18/11 Pt will return home at d/c

## 2011-03-18 NOTE — ED Notes (Signed)
CRITICAL VALUE ALERT  Critical value received:  CO2-42  Date of notification:  03/18/11  Time of notification:  1007  Critical value read back:yes  Nurse who received alert:  t abbott rn  MD notified (1st page):  ghim  Time of first page:  1007  MD notified (2nd page):  Time of second page:  Responding MD:  ghim  Time MD responded:  1007

## 2011-03-18 NOTE — H&P (Signed)
Zachary Ford MRN: [id number] DOB/AGE: [birth year] 56 y.o. Primary Care Physician:Shauntay Brunelli L, MD, MD Admit date: 03/18/2011 Chief Complaint: Shortness of breath HPI: This is a 56 year old with a long known history of COPD. He had been in his usual state of fair health at home when he developed increasing shortness of breath. He says he's been coughing some and coughing up a little bit of sputum. He denies any chest pain. He has not had any hemoptysis. He has been wheezing. He has not noticed any swelling of his legs.  Past Medical History  Diagnosis Date  . COPD (chronic obstructive pulmonary disease)   . Abnormal electrocardiogram   . History of trichomonal urethritis   . Abdominal pain   . Bloating   . Sleep disorder   . Fasting hyperglycemia   . Borderline hypertension   . Hyperlipidemia   . Tobacco abuse    History reviewed. No pertinent past surgical history.      Family History  Problem Relation Age of Onset  . Pancreatic cancer Mother   . Coronary artery disease Mother   . Abnormal EKG Mother   . Hypertension Mother   . Arrhythmia Father   . Colon cancer Neg Hx   . Colon polyps Neg Hx     Social History:  reports that he has been smoking.  He does not have any smokeless tobacco history on file. He reports that he uses illicit drugs (Marijuana). He reports that he does not drink alcohol. He lives near his family who help provide his care  Allergies: No Known Allergies  Medications Prior to Admission  Medication Dose Route Frequency Provider Last Rate Last Dose  . 0.9 %  sodium chloride infusion   Intravenous Once Gavin Pound. Ghim, MD 75 mL/hr at 03/18/11 1131    . 0.9 %  sodium chloride infusion   Intravenous Continuous Fredirick Maudlin, MD 125 mL/hr at 03/18/11 1542    . acetaminophen (TYLENOL) tablet 650 mg  650 mg Oral Q6H PRN Fredirick Maudlin, MD       Or  . acetaminophen (TYLENOL) suppository 650 mg  650 mg Rectal Q6H PRN Fredirick Maudlin, MD      .  albuterol (PROVENTIL) (5 MG/ML) 0.5% nebulizer solution 2.5 mg  2.5 mg Nebulization Once Gavin Pound. Ghim, MD   2.5 mg at 03/18/11 0939  . albuterol (PROVENTIL) (5 MG/ML) 0.5% nebulizer solution 2.5 mg  2.5 mg Nebulization Q6H Fredirick Maudlin, MD      . albuterol (PROVENTIL) (5 MG/ML) 0.5% nebulizer solution 5 mg  5 mg Nebulization Once Gavin Pound. Ghim, MD   2.5 mg at 03/18/11 1047  . albuterol (PROVENTIL) (5 MG/ML) 0.5% nebulizer solution 5 mg  5 mg Nebulization Q6H Gavin Pound. Ghim, MD   2.5 mg at 03/18/11 1338  . albuterol-ipratropium (COMBIVENT) inhaler 2 puff  2 puff Inhalation Q6H PRN Fredirick Maudlin, MD      . alum & mag hydroxide-simeth (MAALOX/MYLANTA) 200-200-20 MG/5ML suspension 30 mL  30 mL Oral Q6H PRN Fredirick Maudlin, MD      . aspirin EC tablet 81 mg  81 mg Oral Daily Fredirick Maudlin, MD   81 mg at 03/18/11 1543  . docusate sodium (COLACE) capsule 100 mg  100 mg Oral BID Fredirick Maudlin, MD   100 mg at 03/18/11 1543  . enoxaparin (LOVENOX) injection 40 mg  40 mg Subcutaneous Daily Fredirick Maudlin, MD   40 mg at 03/18/11 1542  .  Fluticasone-Salmeterol (ADVAIR) 250-50 MCG/DOSE inhaler 1 puff  1 puff Inhalation BID Fredirick Maudlin, MD      . guaiFENesin [facility]) 12 hr tablet 600 mg  600 mg Oral BID Fredirick Maudlin, MD   600 mg at 03/18/11 1543  . ipratropium (ATROVENT) nebulizer solution 0.5 mg  0.5 mg Nebulization Once Gavin Pound. Ghim, MD   0.5 mg at 03/18/11 0939  . ipratropium (ATROVENT) nebulizer solution 0.5 mg  0.5 mg Nebulization Once Gavin Pound. Ghim, MD   0.5 mg at 03/18/11 1047  . ipratropium (ATROVENT) nebulizer solution 0.5 mg  0.5 mg Nebulization Q6H Fredirick Maudlin, MD      . methylPREDNISolone sodium succinate (SOLU-MEDROL) 125 MG injection 125 mg  125 mg Intravenous Q6H Fredirick Maudlin, MD   125 mg at 03/18/11 1542  . moxifloxacin (AVELOX) IVPB 400 mg  400 mg Intravenous Q24H Fredirick Maudlin, MD   400 mg at 03/18/11 1542  . ondansetron (ZOFRAN) tablet 4 mg  4 mg  Oral Q6H PRN Fredirick Maudlin, MD       Or  . ondansetron [facility]) injection 4 mg  4 mg Intravenous Q6H PRN Fredirick Maudlin, MD      . oxyCODONE-acetaminophen (PERCOCET) 5-325 MG per tablet 1 tablet  1 tablet Oral QID Fredirick Maudlin, MD   1 tablet at 03/18/11 1542  . pantoprazole (PROTONIX) EC tablet 40 mg  40 mg Oral Q1200 Fredirick Maudlin, MD   40 mg at 03/18/11 1543  . predniSONE (DELTASONE) tablet 60 mg  60 mg Oral Once Gavin Pound. Ghim, MD   60 mg at 03/18/11 0947  . rOPINIRole (REQUIP) tablet 1 mg  1 mg Oral QHS Fredirick Maudlin, MD      . sodium chloride (OCEAN) 0.65 % nasal spray 1 spray  1 spray Each Nare PRN Fredirick Maudlin, MD       Medications Prior to Admission  Medication Sig Dispense Refill  . albuterol (PROVENTIL HFA;VENTOLIN HFA) 108 (90 BASE) MCG/ACT inhaler Inhale 2 puffs into the lungs 4 (four) times daily.       Marland Kitchen albuterol (PROVENTIL) (2.5 MG/3ML) 0.083% nebulizer solution Take 2.5 mg by nebulization 2 (two) times daily.       Marland Kitchen albuterol-ipratropium (COMBIVENT) 18-103 MCG/ACT inhaler Inhale 2 puffs into the lungs every 6 (six) hours as needed. For shortness of breath      . aspirin EC 81 MG tablet Take 81 mg by mouth daily.      Marland Kitchen doxycycline (VIBRAMYCIN) 100 MG capsule Take 1 capsule (100 mg total) by mouth 2 (two) times daily.  20 capsule  0  . Fluticasone-Salmeterol (ADVAIR) 250-50 MCG/DOSE AEPB Inhale 1 puff into the lungs 2 (two) times daily.       Marland Kitchen ipratropium (ATROVENT) 0.02 % nebulizer solution Take 500 mcg by nebulization 2 (two) times daily.      . Multiple Vitamin (MULITIVITAMIN WITH MINERALS) TABS Take 1 tablet by mouth daily. Centrum  Silver      . omeprazole (PRILOSEC) 40 MG capsule Take 40 mg by mouth every morning.      Marland Kitchen oxyCODONE-acetaminophen (PERCOCET) 5-325 MG per tablet Take 1 tablet by mouth 4 (four) times daily.       Marland Kitchen rOPINIRole (REQUIP) 1 MG tablet Take 1 mg by mouth at bedtime.       . sodium chloride (OCEAN) 0.65 % SOLN nasal spray Place 1  spray into the nose as needed. For dryness  AOZ:HYQMV from the symptoms mentioned above,there are no other symptoms referable to all systems reviewed.  Physical Exam: Blood pressure 138/75, pulse 94, temperature 97.1 F (36.2 C), temperature source Oral, resp. rate 22, height 5\' 4"  (1.626 m), weight 89.4 kg (197 lb 1.5 oz), SpO2 97.00%. He is awake and alert. He is in mild distress because of shortness of breath. His pupils are reactive. His nose and throat are clear. His neck is supple without masses bruits or JVD. His chest shows bilateral rhonchi and end expiratory wheezes. His heart is regular without gallop. His abdomen is soft obese without masses. His extremities showed no edema. His central nervous system examination is grossly intact    Basename 03/18/11 0929  WBC 10.1  NEUTROABS 6.4  HGB 13.6  HCT 44.5  MCV 90.6  PLT 260    Basename 03/18/11 0929  NA 140  K 4.2  CL 93*  CO2 42*  GLUCOSE 143*  BUN 7  CREATININE 0.73  CALCIUM 9.8  MG --  lablast2(ast:2,ALT:2,alkphos:2,bilitot:2,prot:2,albumin:2)@    No results found for this or any previous visit (from the past 240 hour(s)).   Dg Chest 2 View  03/18/2011  *RADIOLOGY REPORT*  Clinical Data: Cough, congestion, shortness of breath and wheezing.  CHEST - 2 VIEW  Comparison: 07/19/2009.  Findings: Trachea is midline.  Heart size normal.  Lungs are hyperinflated.  Added density along the right 8th posterolateral rib may be due to a rib fracture. Additional rib fractures are seen bilaterally.  No pleural fluid.  IMPRESSION: COPD without acute finding.  Original Report Authenticated By: Reyes Ivan, M.D.   Impression: He has COPD exacerbation. He has chronic low back pain. Principal Problem:  *COPD Active Problems:  Low back pain     Plan: He will be admitted for IV steroids antibiotics inhaled bronchodilators      Brittlyn Cloe L Pager [phone]  03/18/2011, 6:38 PM

## 2011-03-18 NOTE — ED Notes (Signed)
Pt c/o sob x 1 week, worsening over night.productive cough-yellow sputum.  Pt seen in ed yesterday.

## 2011-03-18 NOTE — ED Provider Notes (Signed)
History   This chart was scribed for Gavin Pound. Marquita Lias, MD scribed by Magnus Sinning. The patient was seen in room APA09/APA09 seen at 9:11.    CSN: [id number]  Arrival date & time 03/18/11  1191   First MD Initiated Contact with Patient 03/18/11 [phone]      Chief Complaint  Patient presents with  . Shortness of Breath    (Consider location/radiation/quality/duration/timing/severity/associated sxs/prior treatment) HPI Zachary Ford is a 56 y.o. male who presents to the Emergency Department complaining of gradually worsening moderate SOB onset a couple weeks ago with associated productive cough with yellow sputnum ,subjective fever, back pain and headache both of which occur when coughing. Pt was seen in the ED yesterday for an unrelated medical condition, but was given a breathing treatment while being seen with improvement. Pt also states that he did also take cough medication last night with no relief. Denies previously having pneumonia, but does report a hx of COPD. Per relative, pt has been behaving normally and states that the pt does have a home inhaler, which he uses regularly and a rescue inhaler. 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 He explains that since his diagnosis he has cut back to just one pack a day. Pt received his flu shot.  PCP: Dr. Juanetta Gosling   Past Medical History  Diagnosis Date  . COPD (chronic obstructive pulmonary disease)   . Abnormal electrocardiogram   . History of trichomonal urethritis   . Abdominal pain   . Bloating   . Sleep disorder   . Fasting hyperglycemia   . Borderline hypertension   . Hyperlipidemia   . Tobacco abuse     History reviewed. No pertinent past surgical history.  Family History  Problem Relation Age of Onset  . Pancreatic cancer Mother   . Coronary artery disease Mother   . Abnormal EKG Mother   . Hypertension Mother   . Arrhythmia Father     . Colon cancer Neg Hx   . Colon polyps Neg Hx     History  Substance Use Topics  . Smoking status: Current Everyday Smoker -- 0.3 packs/day  . Smokeless tobacco: Not on file   Comment: 70 pack years, now one pack every 3 days  . Alcohol Use: No     Quit 2010, previously 6 pk or more per night     Review of Systems 10 Systems reviewed and are negative for acute change except as noted in the HPI. Allergies  Review of patient's allergies indicates no known allergies.  Home Medications   Current Outpatient Rx  Name Route Sig Dispense Refill  . ALBUTEROL SULFATE HFA 108 (90 BASE) MCG/ACT IN AERS Inhalation Inhale 2 puffs into the lungs 4 (four) times daily.     . ALBUTEROL SULFATE (2.5 MG/3ML) 0.083% IN NEBU Nebulization Take 2.5 mg by nebulization 2 (two) times daily.     Maximino Green[place] 18-103 MCG/ACT IN AERO Inhalation Inhale 2 puffs into the lungs every 6 (six) hours as needed. For shortness of breath    . ASPIRIN EC 81 MG PO TBEC Oral Take 81 mg by mouth daily.    Marland Kitchen DOXYCYCLINE HYCLATE 100 MG PO CAPS Oral Take 1 capsule (100 mg total) by mouth 2 (two) times daily. 20 capsule 0  . FLUTICASONE-SALMETEROL 250-50 MCG/DOSE IN AEPB Inhalation Inhale 1 puff into the lungs 2 (two) times daily.     Marland Kitchen  IPRATROPIUM BROMIDE 0.02 % IN SOLN Nebulization Take 500 mcg by nebulization 2 (two) times daily.    . ADULT MULTIVITAMIN W/MINERALS CH Oral Take 1 tablet by mouth daily. Centrum  Silver    . OMEPRAZOLE 40 MG PO CPDR Oral Take 40 mg by mouth every morning.    . OXYCODONE-ACETAMINOPHEN 5-325 MG PO TABS Oral Take 1 tablet by mouth 4 (four) times daily.     Marland Kitchen ROPINIROLE HCL 1 MG PO TABS Oral Take 1 mg by mouth at bedtime.     Marland Kitchen SALINE 0.65 % NA SOLN Nasal Place 1 spray into the nose as needed. For dryness      BP 149/76  Pulse 107  Temp 98.8 F (37.1 C)  Resp 24  Ht 5\' 5"  (1.651 m)  Wt 204 lb (92.534 kg)  BMI 33.95 kg/m2  SpO2 91%  Physical Exam  Nursing note and vitals  reviewed. Constitutional: He is oriented to person, place, and time. He appears well-developed and well-nourished. No distress.       Mentation normal  HENT:  Head: Normocephalic and atraumatic.  Eyes: EOM are normal. Pupils are equal, round, and reactive to light.  Neck: Neck supple. No tracheal deviation present.  Cardiovascular: Normal rate, regular rhythm and normal heart sounds.   Pulmonary/Chest: Effort normal. No respiratory distress. He has wheezes.       Wheezing, both inspiratory and expiratory ,but no accessory muscle use.Tachypneic with prolonged expirations.     Abdominal: Soft. He exhibits no distension.  Musculoskeletal: Normal range of motion. He exhibits no edema.       Peripheral pulses good and no peripheral edema. Cap refill normal  Neurological: He is alert and oriented to person, place, and time. No sensory deficit.  Skin: Skin is warm and dry. No rash noted.       No cyanosis  Psychiatric: He has a normal mood and affect. His behavior is normal.    ED Course  Procedures (including critical care time)   CRITICAL CARE Performed by: Lear Ng.   Total critical care time: 30 min  Critical care time was exclusive of separately billable procedures and treating other patients.  Critical care was necessary to treat or prevent imminent or life-threatening deterioration.  Critical care was time spent personally by me on the following activities: development of treatment plan with patient and/or surrogate as well as nursing, discussions with consultants, evaluation of patient's response to treatment, examination of patient, obtaining history from patient or surrogate, ordering and performing treatments and interventions, ordering and review of laboratory studies, ordering and review of radiographic studies, pulse oximetry and re-evaluation of patient's condition.    DIAGNOSTIC STUDIES: Oxygen Saturation is 92% on [place], low by my interpretation.     COORDINATION OF CARE: 9:45: Pt given predniSONE tablet 60 mg, albuterol 0.5% nebulizer solution 2.5 mg, and ipratropium nebulizer solution 0.5 mg 10:20: Pt given albuterol 0.5% nebulizer solution 5 mg, and ipratropium nebulizer solution 0.5 mg.   Results for orders placed during the hospital encounter of 03/18/11  CBC      Component Value Range   WBC 10.1  4.0 - 10.5 (K/uL)   RBC 4.91  4.22 - 5.81 (MIL/uL)   Hemoglobin 13.6  13.0 - 17.0 (g/dL)   HCT 16.1  09.6 - 04.5 (%)   MCV 90.6  78.0 - 100.0 (fL)   MCH 27.7  26.0 - 34.0 (pg)   MCHC 30.6  30.0 - 36.0 (g/dL)   RDW 12.8  11.5 - 15.5 (%)   Platelets 260  150 - 400 (K/uL)  DIFFERENTIAL      Component Value Range   Neutrophils Relative 63  43 - 77 (%)   Neutro Abs 6.4  1.7 - 7.7 (K/uL)   Lymphocytes Relative 21  12 - 46 (%)   Lymphs Abs 2.1  0.7 - 4.0 (K/uL)   Monocytes Relative 13 (*) 3 - 12 (%)   Monocytes Absolute 1.3 (*) 0.1 - 1.0 (K/uL)   Eosinophils Relative 3  0 - 5 (%)   Eosinophils Absolute 0.3  0.0 - 0.7 (K/uL)   Basophils Relative 1  0 - 1 (%)   Basophils Absolute 0.1  0.0 - 0.1 (K/uL)  BASIC METABOLIC PANEL      Component Value Range   Sodium 140  135 - 145 (mEq/L)   Potassium 4.2  3.5 - 5.1 (mEq/L)   Chloride 93 (*) 96 - 112 (mEq/L)   CO2 42 (*) 19 - 32 (mEq/L)   Glucose, Bld 143 (*) 70 - 99 (mg/dL)   BUN 7  6 - 23 (mg/dL)   Creatinine, Ser 8.11  0.50 - 1.35 (mg/dL)   Calcium 9.8  8.4 - 91.4 (mg/dL)   GFR calc non Af Amer >90  >90 (mL/min)   GFR calc Af Amer >90  >90 (mL/min)   Dg Chest 2 View  03/18/2011  *RADIOLOGY REPORT*  Clinical Data: Cough, congestion, shortness of breath and wheezing.  CHEST - 2 VIEW  Comparison: 07/19/2009.  Findings: Trachea is midline.  Heart size normal.  Lungs are hyperinflated.  Added density along the right 8th posterolateral rib may be due to a rib fracture. Additional rib fractures are seen bilaterally.  No pleural fluid.  IMPRESSION: COPD without acute finding.  Original  Report Authenticated By: Reyes Ivan, M.D.   I reviewed the above CXR myself.  1. Respiratory distress   2. Bronchitis   3. Hypercarbia       MDM    I personally performed the services described in this documentation, which was scribed in my presence. The recorded information has been reviewed and considered.    Pt with likely acute on chronic COPD exacerbation.  Pt has been intubated once in the past, many years ago.  Pt has progressively felt worse for weeks.  Was seen yesterday and given 1 neb, but no cough meds or steroids.      10:37 AM Pt's pCO2 on venous blood gas was 77.  PH was 7.39.  However his mentation, BP and HR are WNL.    11:08 AM I spoke to Dr. Juanetta Gosling who agrees to admit, ok to go up to room when available, will see pt later in hospital.  Pt reports feeling generally improved, is coughing up yellow sputum.  Still exp wheezing diffusely.    Gavin Pound. Shaena Parkerson, MD 03/18/11 1109

## 2011-03-19 LAB — CBC
HCT: 42.8 % (ref 39.0–52.0)
MCH: 28.4 pg (ref 26.0–34.0)
MCV: 90.1 fL (ref 78.0–100.0)
Platelets: 242 10*3/uL (ref 150–400)
RDW: 12.6 % (ref 11.5–15.5)
WBC: 10 10*3/uL (ref 4.0–10.5)

## 2011-03-19 LAB — BASIC METABOLIC PANEL
BUN: 10 mg/dL (ref 6–23)
Calcium: 9.1 mg/dL (ref 8.4–10.5)
Chloride: 98 mEq/L (ref 96–112)
Creatinine, Ser: 0.61 mg/dL (ref 0.50–1.35)
GFR calc Af Amer: >90 mL/min (ref >90)

## 2011-03-19 MED ORDER — CIPROFLOXACIN HCL 500 MG PO TABS: 500.0000 mg | ORAL_TABLET | Freq: Two times a day (BID) | ORAL | Status: AC

## 2011-03-19 MED ORDER — METHYLPREDNISOLONE 4 MG PO KIT: PACK | ORAL | Status: AC

## 2011-03-19 NOTE — Discharge Summary (Signed)
Physician Discharge Summary  Patient ID: Zachary Ford MRN: [id number] DOB/AGE: [birth year] 56 y.o. Primary Care Physician:Caressa Scearce L, MD, MD Admit date: 03/18/2011 Discharge date: 03/19/2011    Discharge Diagnoses:   Principal Problem:  *COPD Active Problems:  Low back pain   Medication List  As of 03/19/2011 11:58 AM   TAKE these medications         albuterol (2.5 MG/3ML) 0.083% nebulizer solution   Commonly known as: PROVENTIL   Take 2.5 mg by nebulization 2 (two) times daily.      albuterol 108 (90 BASE) MCG/ACT inhaler   Commonly known as: PROVENTIL HFA;VENTOLIN HFA   Inhale 2 puffs into the lungs 4 (four) times daily.      aspirin EC 81 MG tablet   Take 81 mg by mouth daily.      ciprofloxacin 500 MG tablet   Commonly known as: CIPRO   Take 1 tablet (500 mg total) by mouth 2 (two) times daily.      COMBIVENT 18-103 MCG/ACT inhaler   Generic drug: albuterol-ipratropium   Inhale 2 puffs into the lungs every 6 (six) hours as needed. For shortness of breath      doxycycline 100 MG capsule   Commonly known as: VIBRAMYCIN   Take 1 capsule (100 mg total) by mouth 2 (two) times daily.      Fluticasone-Salmeterol 250-50 MCG/DOSE Aepb   Commonly known as: ADVAIR   Inhale 1 puff into the lungs 2 (two) times daily.      ipratropium 0.02 % nebulizer solution   Commonly known as: ATROVENT   Take 500 mcg by nebulization 2 (two) times daily.      methylPREDNISolone 4 MG tablet   Commonly known as: MEDROL DOSEPAK   follow package directions      mulitivitamin with minerals Tabs   Take 1 tablet by mouth daily. Centrum  Silver      omeprazole 40 MG capsule   Commonly known as: PRILOSEC   Take 40 mg by mouth every morning.      oxyCODONE-acetaminophen 5-325 MG per tablet   Commonly known as: PERCOCET   Take 1 tablet by mouth 4 (four) times daily.      rOPINIRole 1 MG tablet   Commonly known as: REQUIP   Take 1 mg by mouth at bedtime.      sodium chloride  0.65 % Soln nasal spray   Commonly known as: OCEAN   Place 1 spray into the nose as needed. For dryness            Discharged Condition:improved    Consults:none  Significant Diagnostic Studies: Dg Chest 2 View  03/18/2011  *RADIOLOGY REPORT*  Clinical Data: Cough, congestion, shortness of breath and wheezing.  CHEST - 2 VIEW  Comparison: 07/19/2009.  Findings: Trachea is midline.  Heart size normal.  Lungs are hyperinflated.  Added density along the right 8th posterolateral rib may be due to a rib fracture. Additional rib fractures are seen bilaterally.  No pleural fluid.  IMPRESSION: COPD without acute finding.  Original Report Authenticated By: Reyes Ivan, M.D.    Lab Results: Basic Metabolic Panel:  Basename 03/19/11 0505 03/18/11 0929  NA 141 140  K 4.2 4.2  CL 98 93*  CO2 36* 42*  GLUCOSE 180* 143*  BUN 10 7  CREATININE 0.61 0.73  CALCIUM 9.1 9.8  MG -- --  PHOS -- --   Liver Function Tests: No results found for this basename: AST:2,ALT:2,ALKPHOS:2,BILITOT:2,PROT:2,ALBUMIN:2 in the  last 72 hours   CBC:  Basename 03/19/11 0505 03/18/11 0929  WBC 10.0 10.1  NEUTROABS -- 6.4  HGB 13.5 13.6  HCT 42.8 44.5  MCV 90.1 90.6  PLT 242 260    No results found for this or any previous visit (from the past 240 hour(s)).   Hospital Course: he had been to the emergency room twice in a 24-hour period so it was felt that he needed to be admitted.he was started on IV steroids antibiotics and inhaled bronchodilators and was better within hours. He wanted to go home the next morning and was much improved.  Discharge Exam: Blood pressure 137/78, pulse 78, temperature 97.3 F (36.3 C), temperature source Oral, resp. rate 20, height 5\' 4"  (1.626 m), weight 89.4 kg (197 lb 1.5 oz), SpO2 94.00%. His chest was clear with no wheezing. His heart was regular. He was in  No distress  Disposition: home with home health services  Discharge Orders    Future Orders Please  Complete By Expires   Home Health      Questions: Responses:   To provide the following care/treatments RN   Face-to-face encounter      Comments:   I Cleotha Tsang L certify that this patient is under my care and that I, or a nurse practitioner or physician's assistant working with me, had a face-to-face encounter that meets the physician face-to-face encounter requirements with this patient on 03/19/2011.       Questions: Responses:   The encounter with the patient was in whole, or in part, for the following medical condition, which is the primary reason for home health care COPD   I certify that, based on my findings, the following services are medically necessary home health services Nursing   My clinical findings support the need for the above services Acute exacerbation of COPD   Further, I certify that my clinical findings support that this patient is homebound (i.e. absences from home require considerable and taxing effort and are for medical reasons or religious services or infrequently or of short duration when for other reasons) Shortness of Breath with activity   To provide the following care/treatments RN   Discharge patient         Follow-up Information    Follow up with Fredirick Maudlin, MD .         Signed: Fredirick Maudlin Pager [phone]  03/19/2011, 11:58 AM

## 2011-03-19 NOTE — Progress Notes (Signed)
Subjective: He says he feels much better and wants to go home. He is breathing better he's not coughing he's not wheezing.  Objective: Vital signs in last 24 hours: Temp:  [97.1 F (36.2 C)-98.8 F (37.1 C)] 97.3 F (36.3 C) (01/25 0541) Pulse Rate:  [78-107] 78  (01/25 0541) Resp:  [15-24] 20  (01/25 0541) BP: (121-149)/(68-97) 137/78 mmHg (01/25 0541) SpO2:  [91 %-98 %] 94 % (01/25 0740) Weight:  [89.4 kg (197 lb 1.5 oz)-92.534 kg (204 lb)] 89.4 kg (197 lb 1.5 oz) (01/24 1518) Weight change:  Last BM Date: 03/18/11  Intake/Output from previous day: 01/24 0701 - 01/25 0700 In: 1077 [I.V.:1077] Out: 800 [Urine:800]  PHYSICAL EXAM General appearance: alert, cooperative and no distress Resp: clear to auscultation bilaterally Cardio: regular rate and rhythm, S1, S2 normal, no murmur, click, rub or gallop GI: soft, non-tender; bowel sounds normal; no masses,  no organomegaly Extremities: extremities normal, atraumatic, no cyanosis or edema  Lab Results:    Basic Metabolic Panel:  Basename 03/19/11 0505 03/18/11 0929  NA 141 140  K 4.2 4.2  CL 98 93*  CO2 36* 42*  GLUCOSE 180* 143*  BUN 10 7  CREATININE 0.61 0.73  CALCIUM 9.1 9.8  MG -- --  PHOS -- --   Liver Function Tests: No results found for this basename: AST:2,ALT:2,ALKPHOS:2,BILITOT:2,PROT:2,ALBUMIN:2 in the last 72 hours No results found for this basename: LIPASE:2,AMYLASE:2 in the last 72 hours No results found for this basename: AMMONIA:2 in the last 72 hours CBC:  Basename 03/19/11 0505 03/18/11 0929  WBC 10.0 10.1  NEUTROABS -- 6.4  HGB 13.5 13.6  HCT 42.8 44.5  MCV 90.1 90.6  PLT 242 260   Cardiac Enzymes: No results found for this basename: CKTOTAL:3,CKMB:3,CKMBINDEX:3,TROPONINI:3 in the last 72 hours BNP: No results found for this basename: PROBNP:3 in the last 72 hours D-Dimer: No results found for this basename: DDIMER:2 in the last 72 hours CBG: No results found for this basename:  GLUCAP:6 in the last 72 hours Hemoglobin A1C: No results found for this basename: HGBA1C in the last 72 hours Fasting Lipid Panel: No results found for this basename: CHOL,HDL,LDLCALC,TRIG,CHOLHDL,LDLDIRECT in the last 72 hours Thyroid Function Tests: No results found for this basename: TSH,T4TOTAL,FREET4,T3FREE,THYROIDAB in the last 72 hours Anemia Panel: No results found for this basename: VITAMINB12,FOLATE,FERRITIN,TIBC,IRON,RETICCTPCT in the last 72 hours Coagulation: No results found for this basename: LABPROT:2,INR:2 in the last 72 hours Urine Drug Screen: Drugs of Abuse     Component Value Date/Time   LABOPIA POSITIVE* 08/15/2006 0952   COCAINSCRNUR NONE DETECTED 08/15/2006 0952   LABBENZ NONE DETECTED 08/15/2006 0952   AMPHETMU NONE DETECTED 08/15/2006 0952   THCU POSITIVE* 08/15/2006 0952   LABBARB  Value: NONE DETECTED        DRUG SCREEN FOR MEDICAL PURPOSES ONLY.  IF CONFIRMATION IS NEEDED FOR ANY PURPOSE, NOTIFY LAB WITHIN 5 DAYS. 08/15/2006 0952    Alcohol Level: No results found for this basename: ETH:2 in the last 72 hours Urinalysis:  Misc. Labs:  ABGS No results found for this basename: PHART,PCO2,PO2ART,TCO2,HCO3 in the last 72 hours CULTURES No results found for this or any previous visit (from the past 240 hour(s)). Studies/Results: Dg Chest 2 View  03/18/2011  *RADIOLOGY REPORT*  Clinical Data: Cough, congestion, shortness of breath and wheezing.  CHEST - 2 VIEW  Comparison: 07/19/2009.  Findings: Trachea is midline.  Heart size normal.  Lungs are hyperinflated.  Added density along the right 8th posterolateral rib  may be due to a rib fracture. Additional rib fractures are seen bilaterally.  No pleural fluid.  IMPRESSION: COPD without acute finding.  Original Report Authenticated By: Reyes Ivan, M.D.    Medications:  Prior to Admission:  Prescriptions prior to admission  Medication Sig Dispense Refill  . albuterol (PROVENTIL HFA;VENTOLIN HFA) 108 (90  BASE) MCG/ACT inhaler Inhale 2 puffs into the lungs 4 (four) times daily.       Marland Kitchen albuterol (PROVENTIL) (2.5 MG/3ML) 0.083% nebulizer solution Take 2.5 mg by nebulization 2 (two) times daily.       Marland Kitchen albuterol-ipratropium (COMBIVENT) 18-103 MCG/ACT inhaler Inhale 2 puffs into the lungs every 6 (six) hours as needed. For shortness of breath      . aspirin EC 81 MG tablet Take 81 mg by mouth daily.      Marland Kitchen doxycycline (VIBRAMYCIN) 100 MG capsule Take 1 capsule (100 mg total) by mouth 2 (two) times daily.  20 capsule  0  . Fluticasone-Salmeterol (ADVAIR) 250-50 MCG/DOSE AEPB Inhale 1 puff into the lungs 2 (two) times daily.       Marland Kitchen ipratropium (ATROVENT) 0.02 % nebulizer solution Take 500 mcg by nebulization 2 (two) times daily.      . Multiple Vitamin (MULITIVITAMIN WITH MINERALS) TABS Take 1 tablet by mouth daily. Centrum  Silver      . omeprazole (PRILOSEC) 40 MG capsule Take 40 mg by mouth every morning.      Marland Kitchen oxyCODONE-acetaminophen (PERCOCET) 5-325 MG per tablet Take 1 tablet by mouth 4 (four) times daily.       Marland Kitchen rOPINIRole (REQUIP) 1 MG tablet Take 1 mg by mouth at bedtime.       . sodium chloride (OCEAN) 0.65 % SOLN nasal spray Place 1 spray into the nose as needed. For dryness       Scheduled:   . sodium chloride   Intravenous Once  . albuterol  2.5 mg Nebulization Once  . albuterol  5 mg Nebulization Once  . albuterol  5 mg Nebulization Q4H WA  . aspirin EC  81 mg Oral Daily  . docusate sodium  100 mg Oral BID  . enoxaparin  40 mg Subcutaneous Daily  . Fluticasone-Salmeterol  1 puff Inhalation BID  . guaiFENesin  600 mg Oral BID  . ipratropium  0.5 mg Nebulization Once  . ipratropium  0.5 mg Nebulization Once  . ipratropium  0.5 mg Nebulization Q4H WA  . methylPREDNISolone (SOLU-MEDROL) injection  125 mg Intravenous Q6H  . moxifloxacin  400 mg Intravenous Q24H  . oxyCODONE-acetaminophen  1 tablet Oral QID  . pantoprazole  40 mg Oral Q1200  . predniSONE  60 mg Oral Once  .  rOPINIRole  1 mg Oral QHS  . DISCONTD: albuterol  2.5 mg Nebulization Q6H  . DISCONTD: albuterol  5 mg Nebulization Q6H  . DISCONTD: ipratropium  0.5 mg Nebulization Q6H   Continuous:   . sodium chloride 125 mL/hr at 03/19/11 0730   ZOX:[id number], acetaminophen, albuterol-ipratropium, alum & mag hydroxide-simeth, ondansetron (ZOFRAN) IV, ondansetron, sodium chloride  Assesment: He is much improved. He says he wants to go home. He discussed this at length and he does not want to stay another 24 hours to make sure he is okay. I think is probably ready for discharge plan 1 discharge him home Principal Problem:  *COPD Active Problems:  Low back pain    Plan: Discharge home today    LOS: 1 day   Elvia Aydin L 03/19/2011, 8:31  AM

## 2011-03-19 NOTE — Progress Notes (Signed)
CARE MANAGEMENT NOTE 03/19/2011  Patient:  Zachary Ford, Zachary Ford   Account Number:  [id number]  Date Initiated:  03/19/2011  Documentation initiated by:  Rosemary Holms  Subjective/Objective Assessment:   Pt admitted with difficulty breathing/COPD. PTA lived at home with sister as near by neighbor.     Action/Plan:   PT to dc with HH RN. Arrangements made with sister and [facility] to RN to assess pt at her home   Anticipated DC Date:  03/19/2011   Anticipated DC Plan:  HOME W HOME HEALTH SERVICES      DC Planning Services  CM consult      Choice offered to / List presented to:          [facility] arranged  HH-1 RN  HH-10 DISEASE MANAGEMENT      HH agency  [facility]   Status of service:  Completed, signed off Medicare Important Message given?   (If response is "NO", the following Medicare IM given date fields will be blank) Date Medicare IM given:   Date Additional Medicare IM given:    Discharge Disposition:  HOME W HOME HEALTH SERVICES  Per UR Regulation:    Comments:  03/19/11 [address] Leanord Hawking RN BSN CM Sister hm # [phone] Cell [phone] Address [address], [place].

## 2011-06-25 IMAGING — CR DG CHEST 2V
2 series · 2 of 2 positions shown · non-contrast
Comparison: 08/15/2006.

CLINICAL DATA: Shortness of breath.

CHEST - 2 VIEW

[view not recorded (1 of 2)]
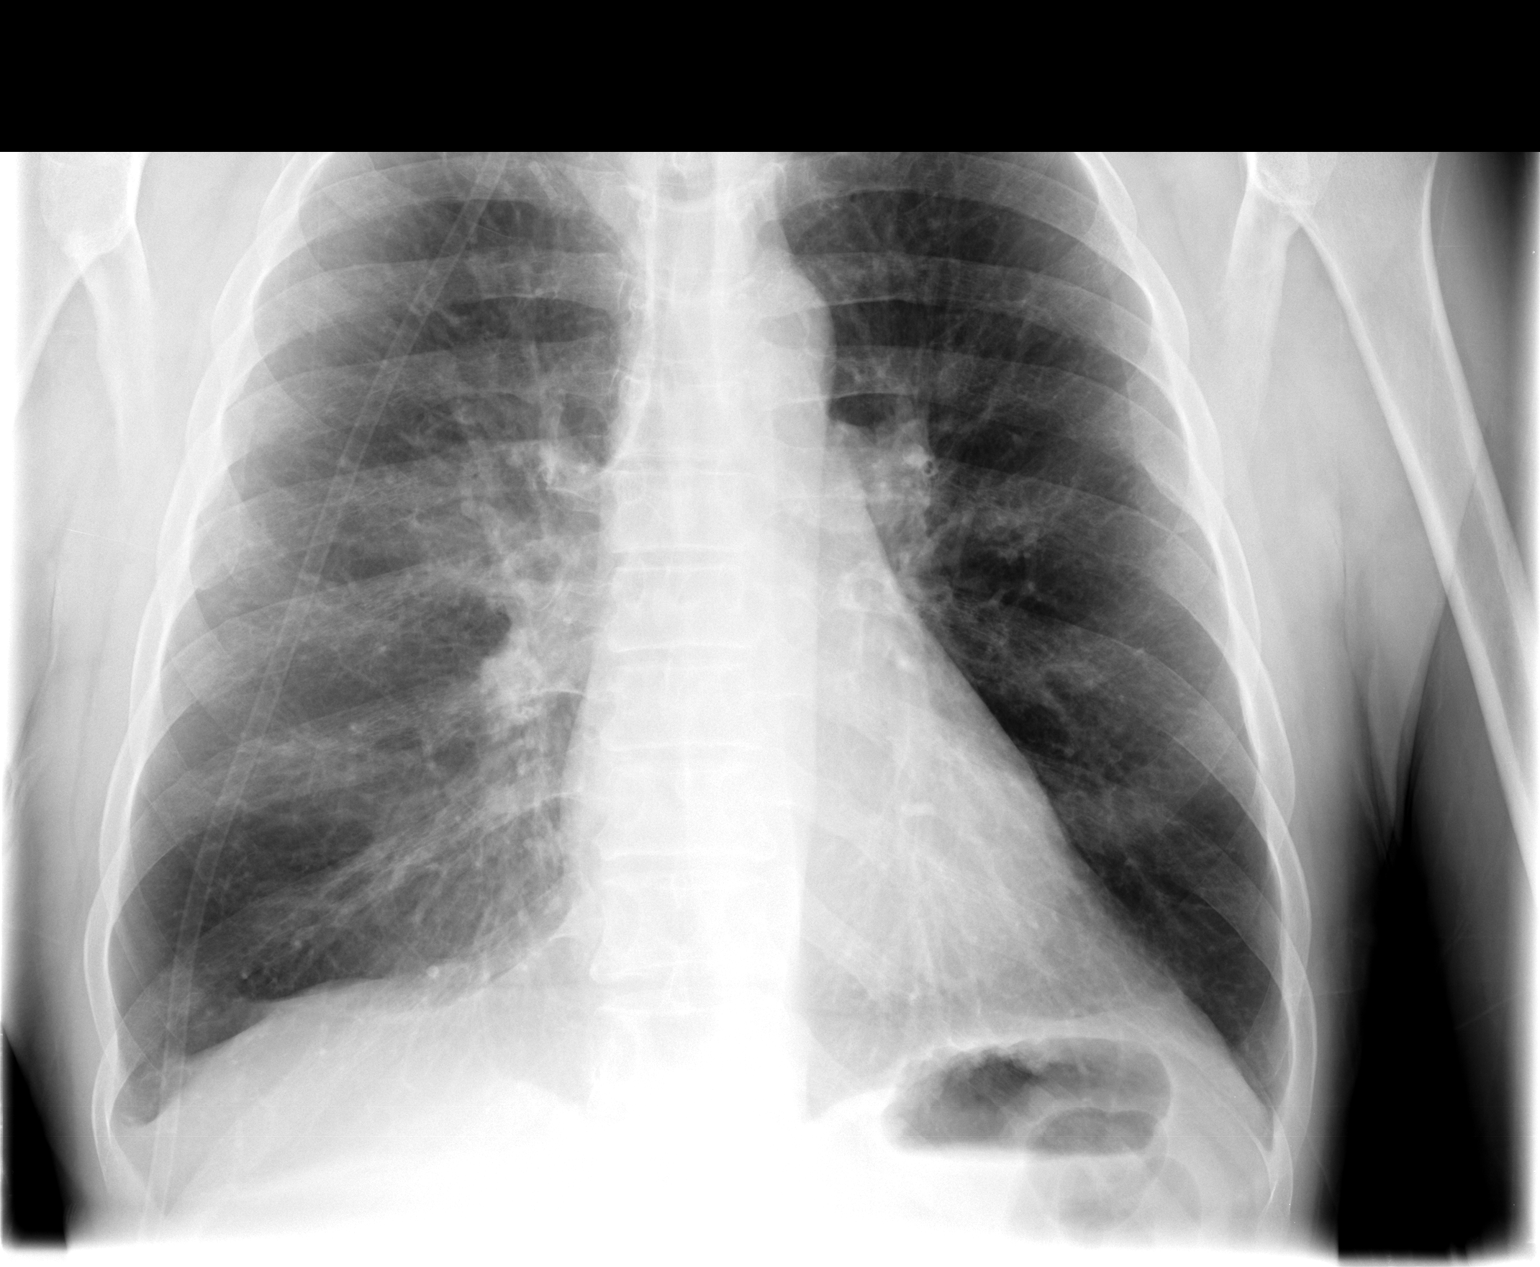

[view not recorded (2 of 2)]
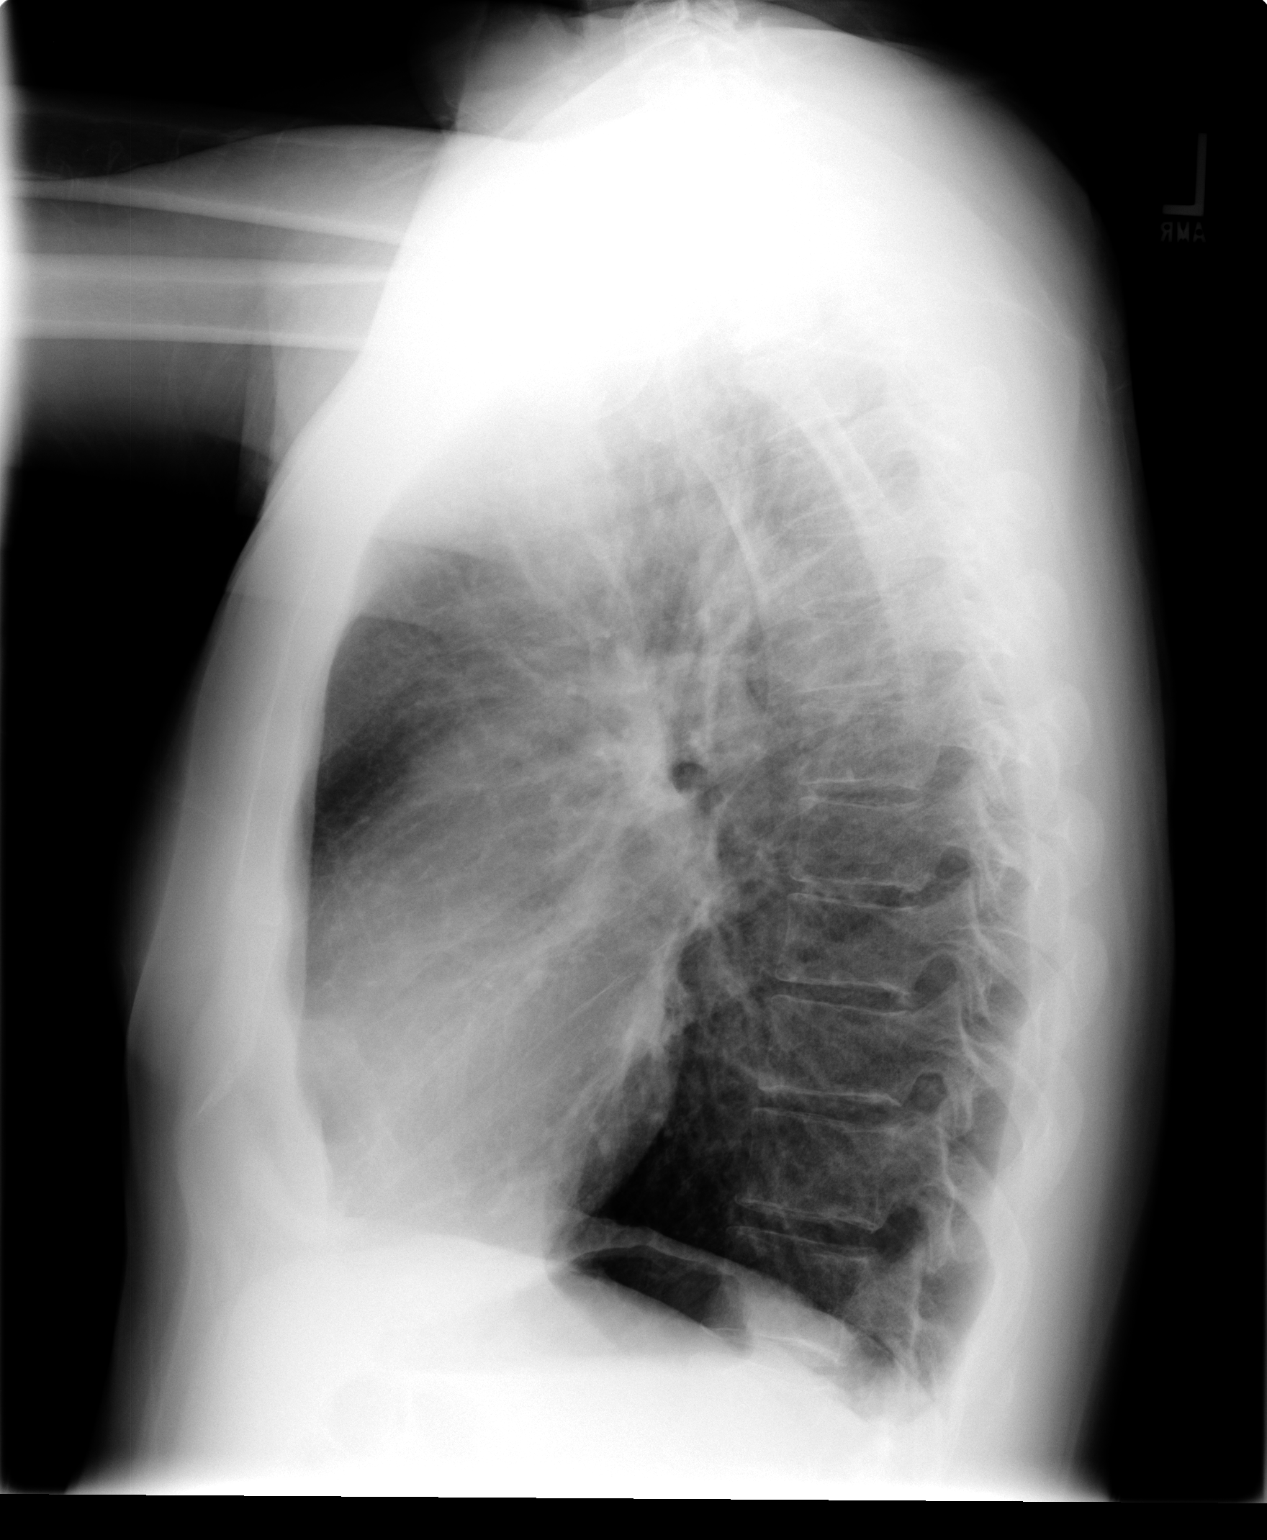

[2 of 2 positions shown; findings below may reference images not displayed]

FINDINGS: Lungs are hyperexpanded.  No focal consolidation, edema,
or effusion. Interstitial markings are diffusely coarsened with
chronic features. The cardiopericardial silhouette is within normal
limits for size. Imaged bony structures of the thorax are intact.
IMPRESSION: Stable exam.  Emphysema without acute cardiopulmonary process.

## 2011-06-26 IMAGING — CR DG CHEST 1V PORT
1 series · 1 of 1 positions shown · non-contrast
Comparison: 09/27/2008

CLINICAL DATA: COPD exacerbation, endotracheal tube insertion

PORTABLE CHEST - 1 VIEW

[view not recorded]
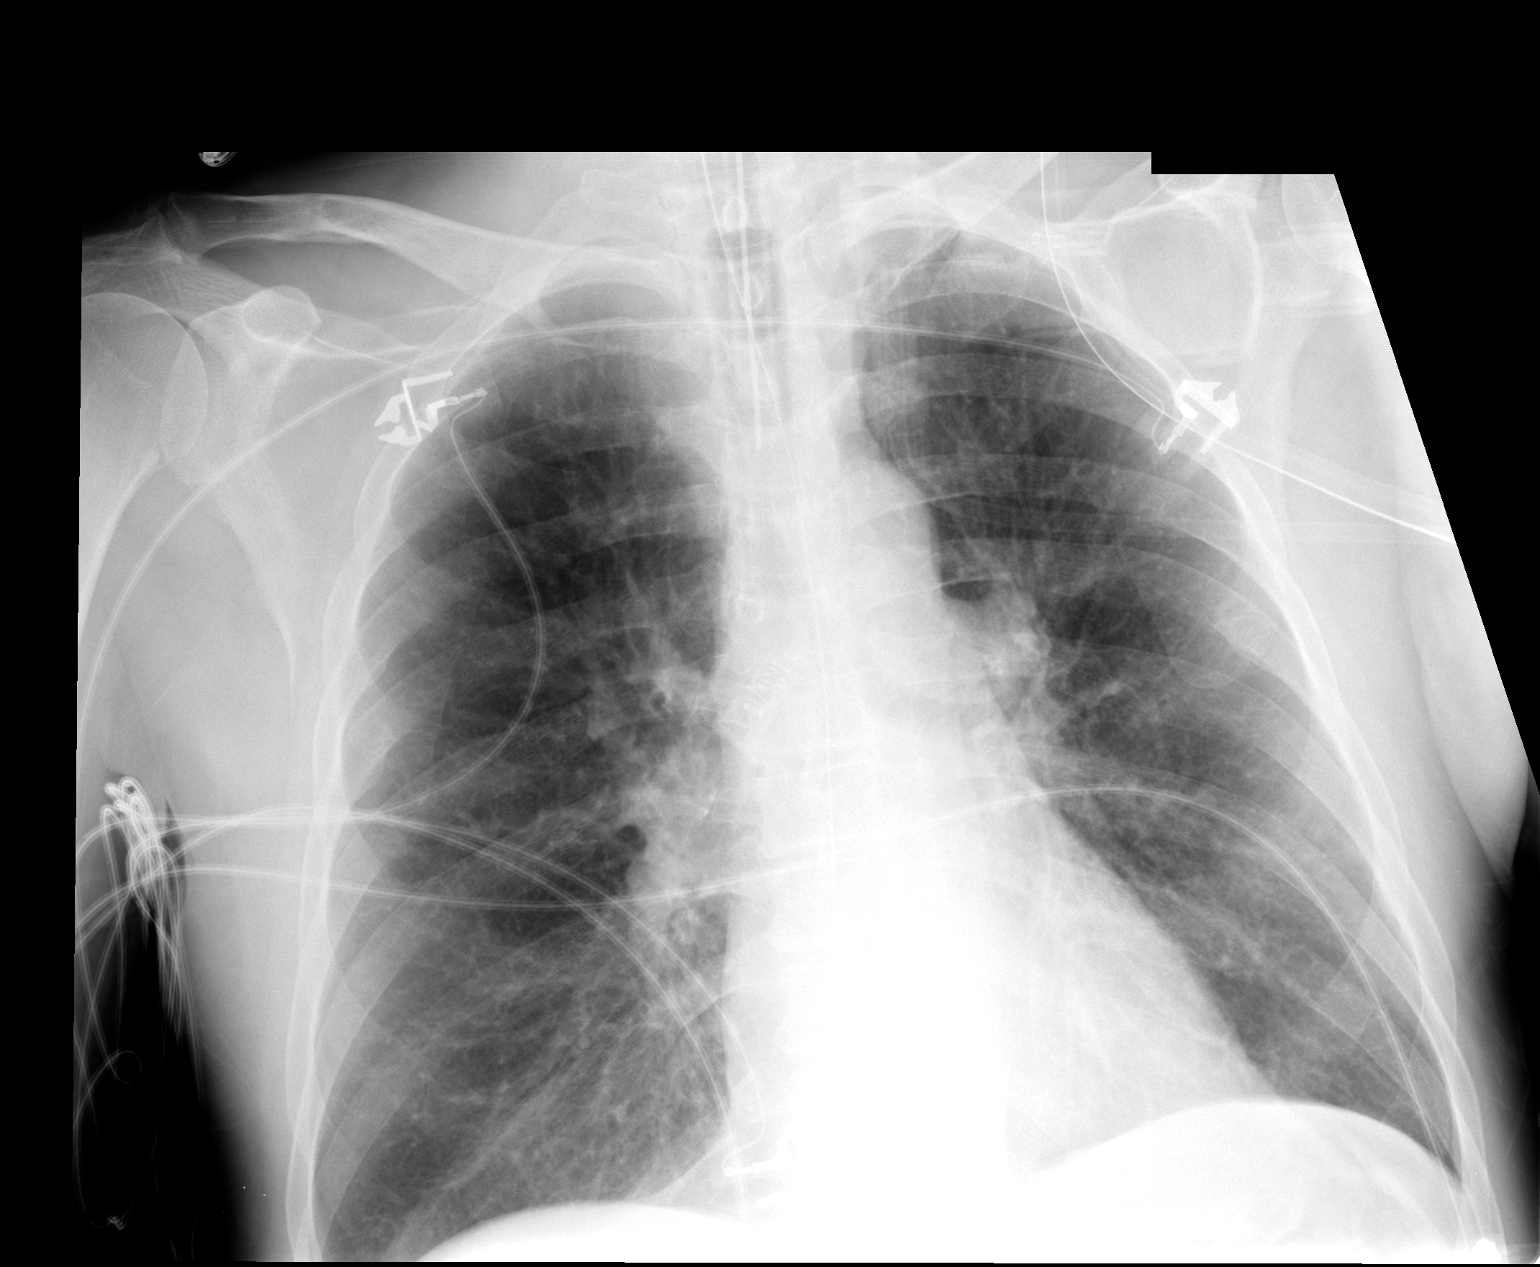

[1 of 1 positions shown; findings below may reference images not displayed]

FINDINGS: Endotracheal tube is 5.4 cm above the carina.  NG tube is
entering the proximal stomach, tip not visualized.  Lungs are
hyperinflated.  Mild central bronchial thickening.  No edema,
consolidation, definite pneumonia, large effusion or pneumothorax.
Right costophrenic angle is excluded on the study.
IMPRESSION: Endotracheal tube 5.4 cm above the carina.
COPD/emphysema, stable.   no acute edema or definite pneumonia

## 2011-06-27 IMAGING — CR DG CHEST 1V PORT
1 series · 1 of 1 positions shown · non-contrast
Comparison: the previous day's study

CLINICAL DATA: COPD exacerbation

PORTABLE CHEST - 1 VIEW

[view not recorded]
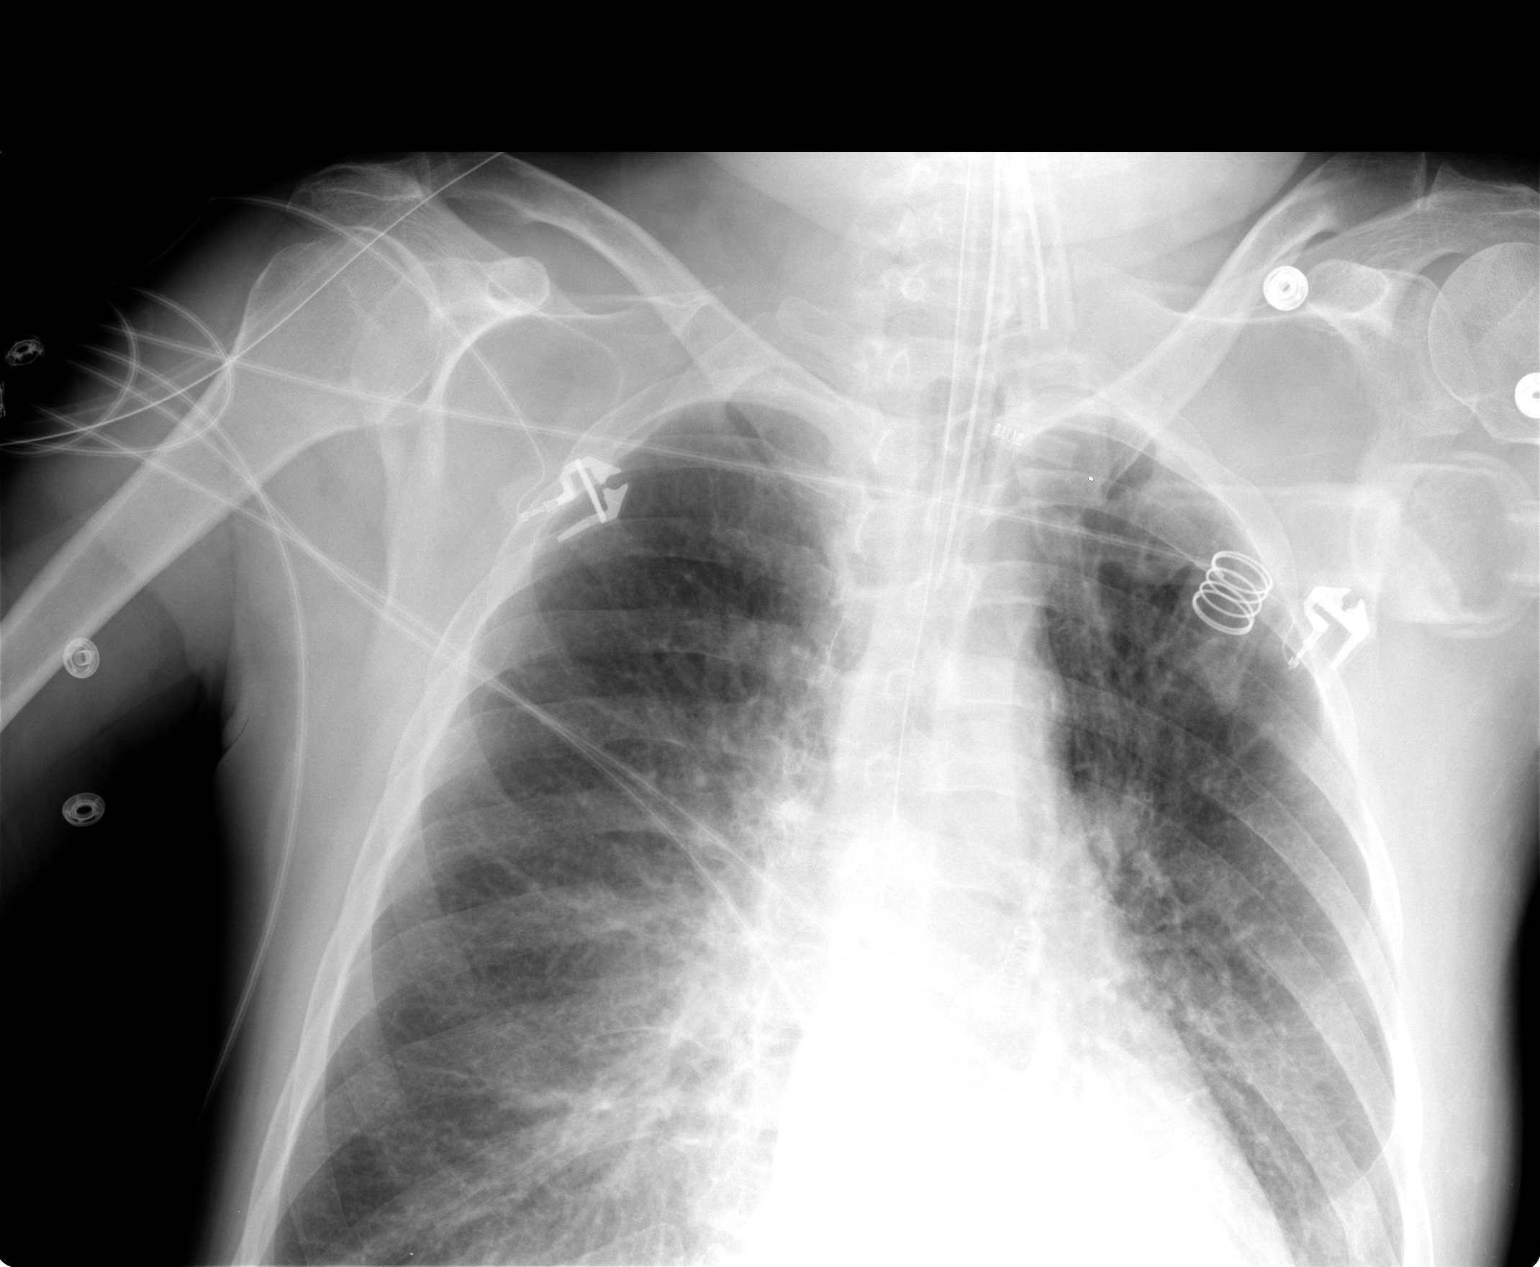

[1 of 1 positions shown; findings below may reference images not displayed]

FINDINGS: Endotracheal tube and nasogastric tube are stable in
position.  Lung bases are excluded.  There has been some increase
in perihilar interstitial edema or infiltrates.  Heart size remains
normal.
IMPRESSION: 1.  Worsening perihilar interstitial edema/infiltrates.
2. Support hardware stable in position.

## 2012-03-01 IMAGING — CR DG CHEST 2V
3 series · 3 of 3 positions shown · non-contrast
Comparison: Chest x-ray of 03/18/2009

CLINICAL DATA: Right-sided chest pain, right upper quadrant pain

CHEST - 2 VIEW

[view not recorded (1 of 3)]
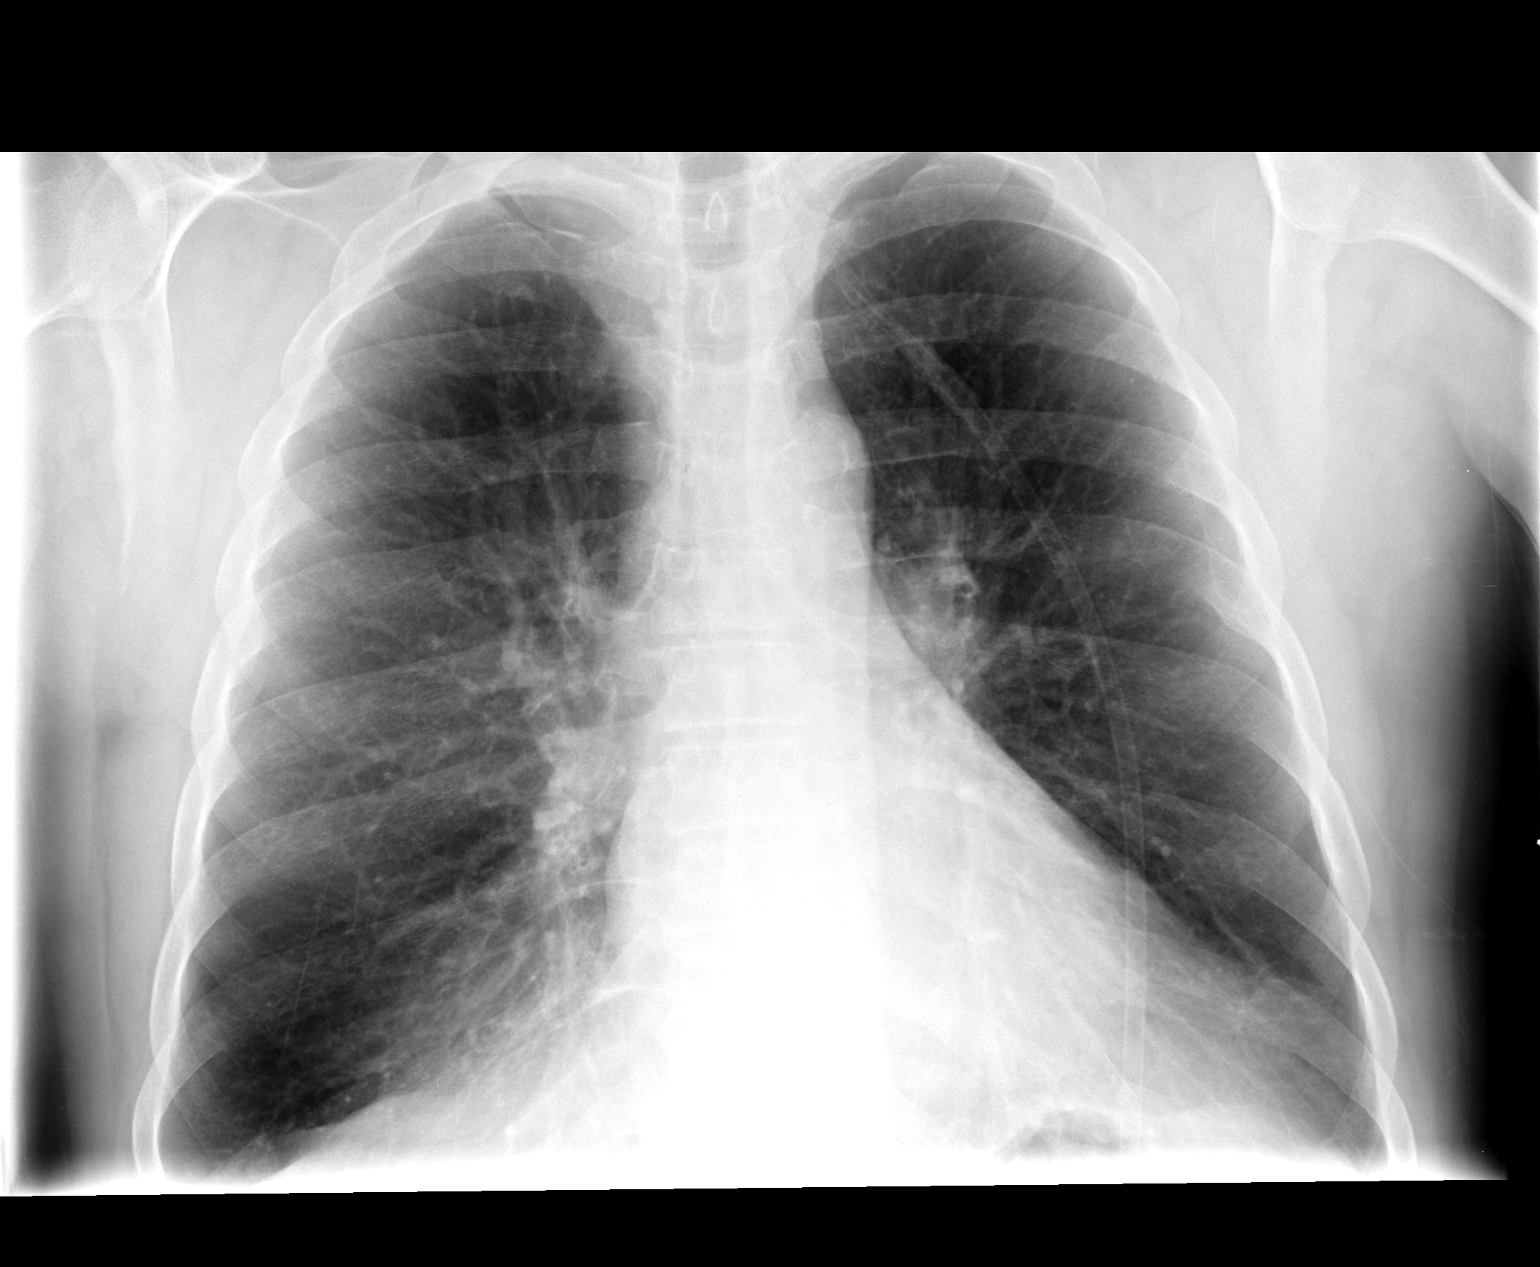

[view not recorded (2 of 3)]
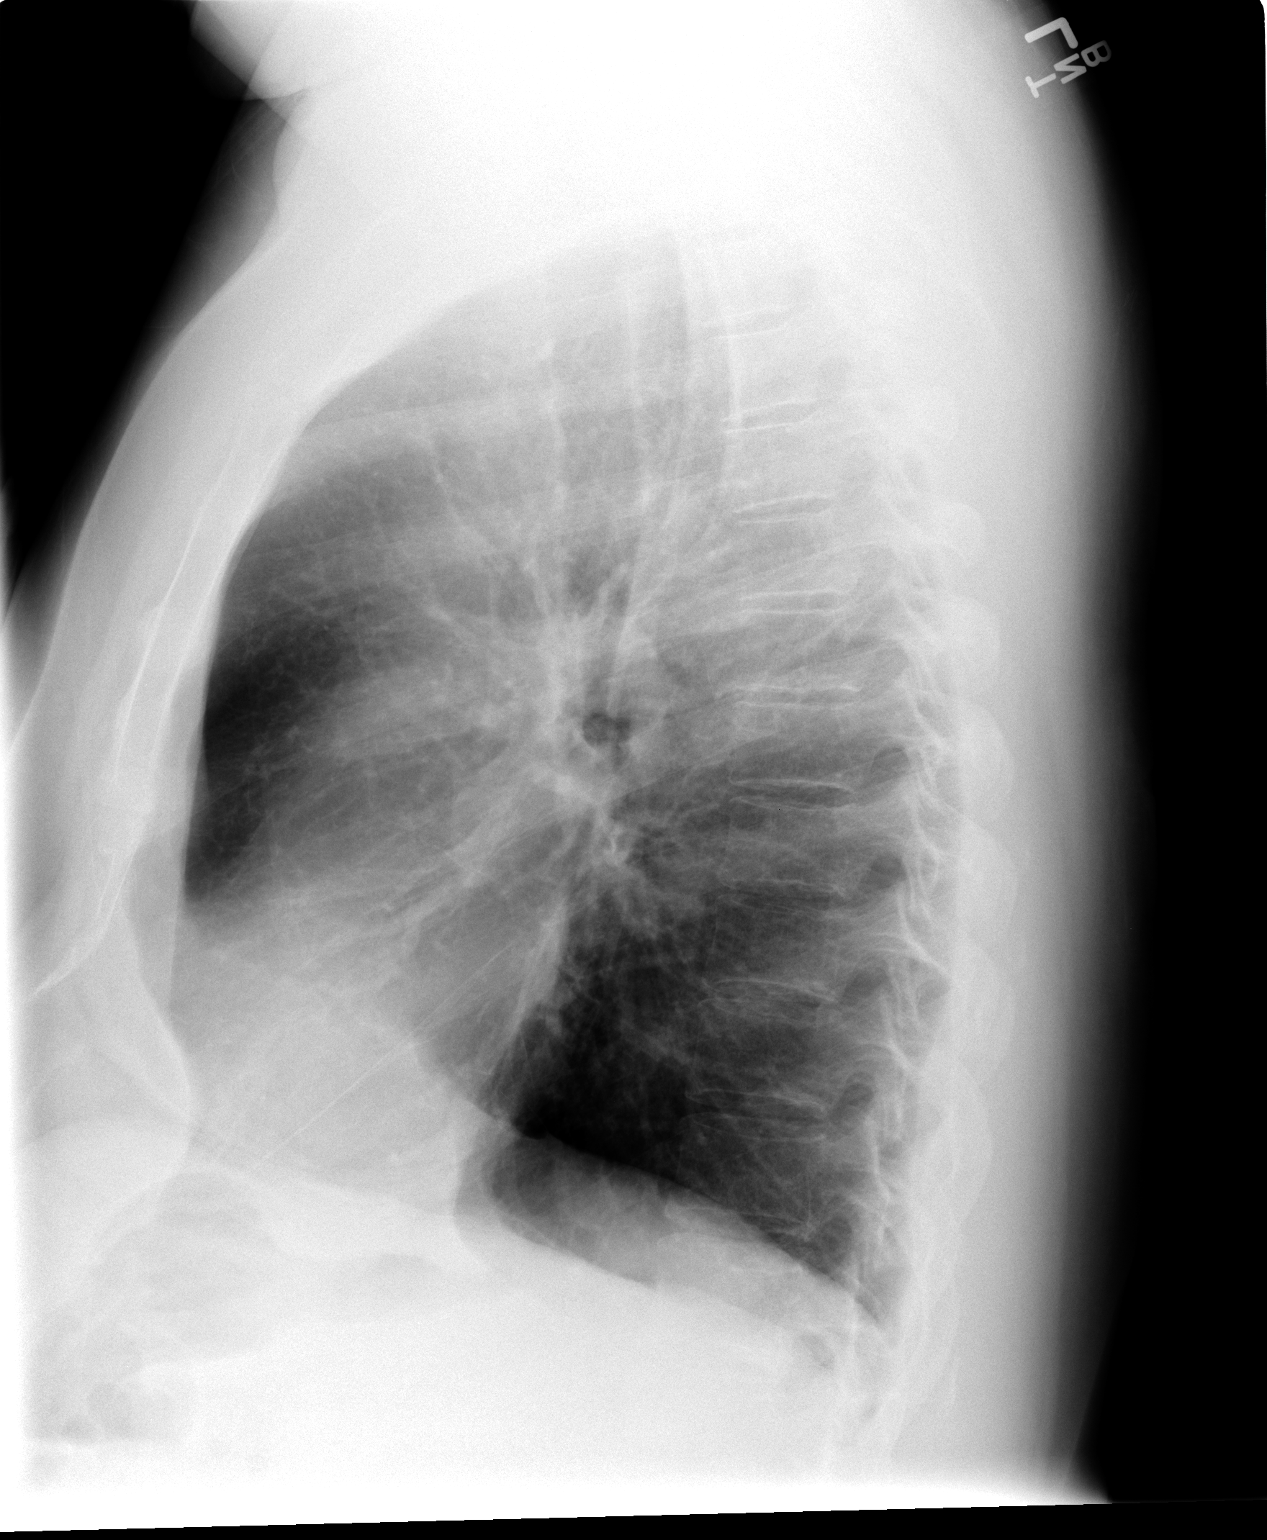

[view not recorded (3 of 3)]
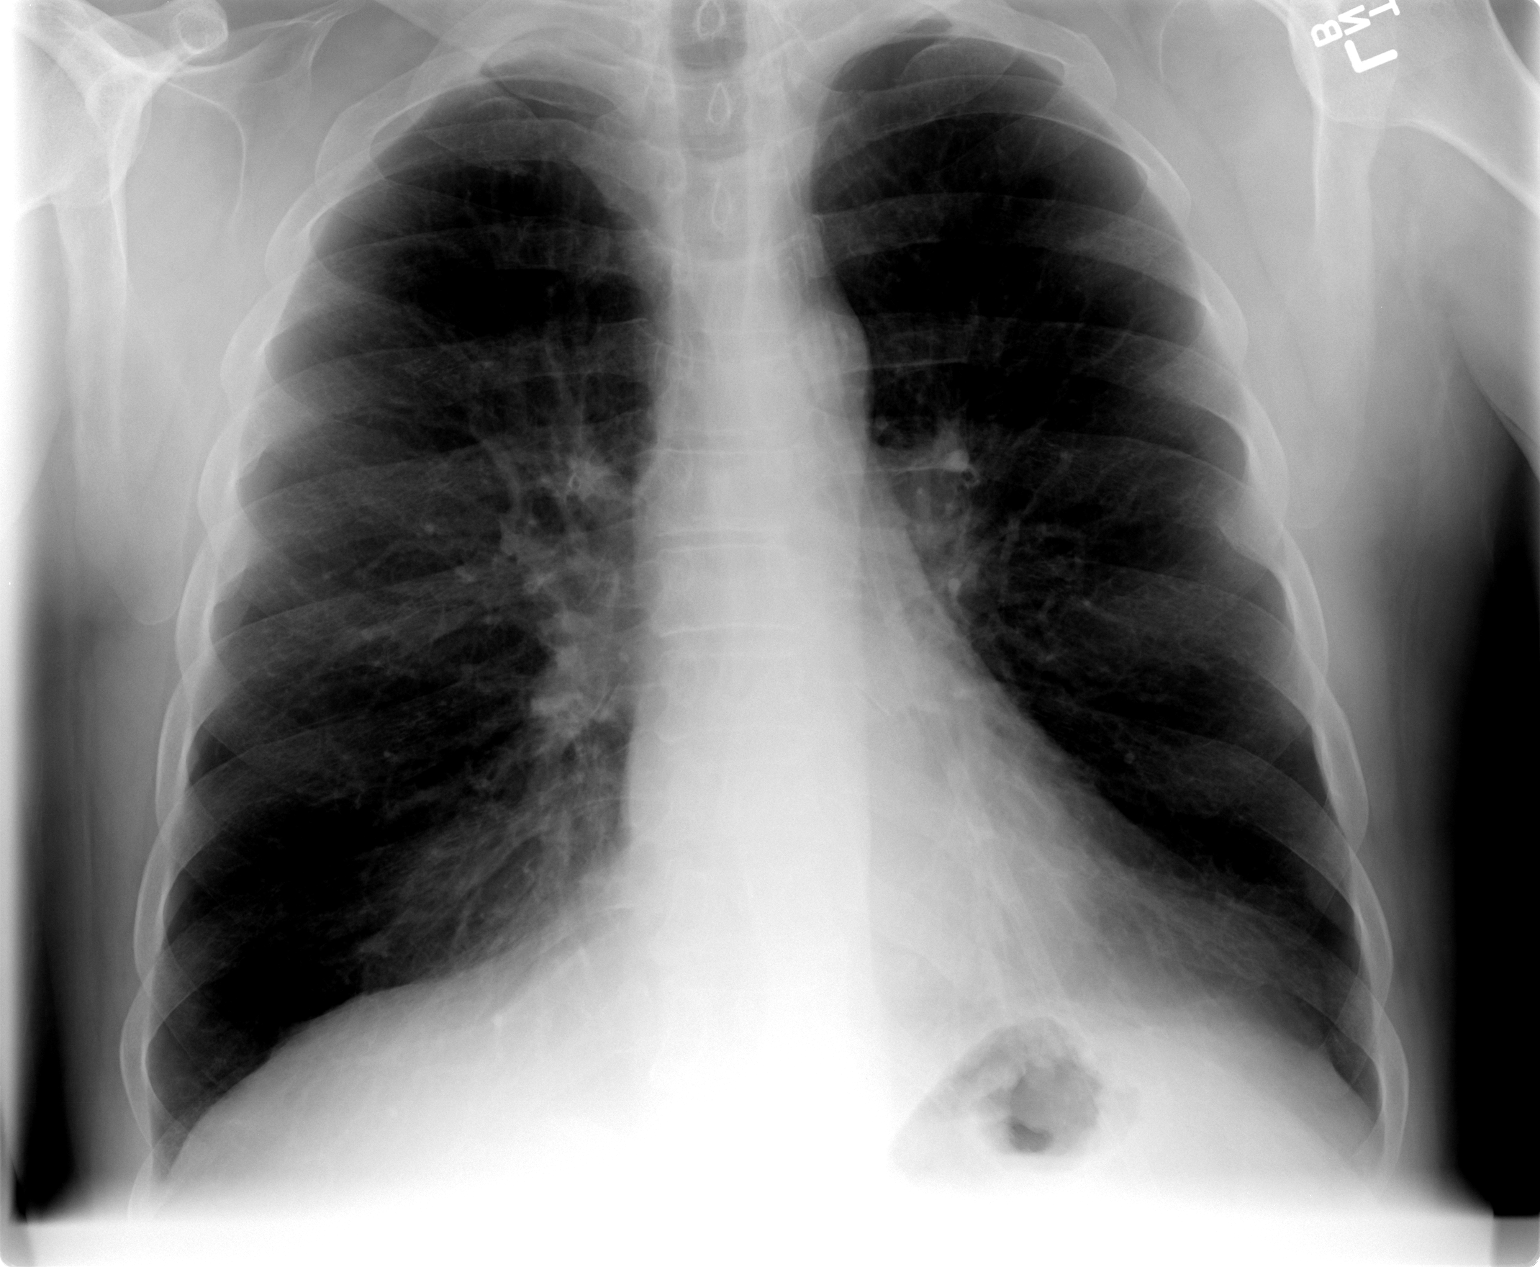

[3 of 3 positions shown; findings below may reference images not displayed]

FINDINGS: The lungs are hyperaerated consistent with COPD.
Peribronchial thickening is noted indicative of bronchitis.  The
heart is mildly enlarged and stable.  No acute bony abnormality is
seen.
IMPRESSION: COPD and probable chronic bronchitis.  No active process.

## 2012-12-19 ENCOUNTER — Encounter (INDEPENDENT_AMBULATORY_CARE_PROVIDER_SITE_OTHER): Payer: Self-pay

## 2012-12-19 ENCOUNTER — Ambulatory Visit (INDEPENDENT_AMBULATORY_CARE_PROVIDER_SITE_OTHER): Payer: Medicaid Other | Admitting: Urology

## 2012-12-19 DIAGNOSIS — R3915 Urgency of urination: Secondary | ICD-10-CM

## 2012-12-19 DIAGNOSIS — R350 Frequency of micturition: Secondary | ICD-10-CM

## 2012-12-19 DIAGNOSIS — R1012 Left upper quadrant pain: Secondary | ICD-10-CM

## 2012-12-19 DIAGNOSIS — N3941 Urge incontinence: Secondary | ICD-10-CM

## 2013-01-23 ENCOUNTER — Ambulatory Visit (INDEPENDENT_AMBULATORY_CARE_PROVIDER_SITE_OTHER): Payer: Medicaid Other | Admitting: Urology

## 2013-01-23 ENCOUNTER — Encounter (INDEPENDENT_AMBULATORY_CARE_PROVIDER_SITE_OTHER): Payer: Self-pay

## 2013-01-23 DIAGNOSIS — R3915 Urgency of urination: Secondary | ICD-10-CM

## 2013-01-23 DIAGNOSIS — R350 Frequency of micturition: Secondary | ICD-10-CM

## 2013-02-26 ENCOUNTER — Encounter (INDEPENDENT_AMBULATORY_CARE_PROVIDER_SITE_OTHER): Payer: Self-pay | Admitting: *Deleted

## 2013-02-26 NOTE — Telephone Encounter (Signed)
This encounter was created in error - please disregard.

## 2013-05-23 DEATH — deceased

## 2013-07-24 ENCOUNTER — Ambulatory Visit: Payer: Medicaid Other | Admitting: Urology

## 2014-07-04 ENCOUNTER — Encounter: Payer: Self-pay | Admitting: Gastroenterology
